# Patient Record
Sex: Female | Born: 1980 | Race: White | Hispanic: No | Marital: Single | Smoking: Current every day smoker
Health system: Southern US, Community
[De-identification: ages and names within clinical notes are randomized; demographics above are authoritative.]

## PROBLEM LIST (undated history)

## (undated) DIAGNOSIS — F99 Mental disorder, not otherwise specified: Secondary | ICD-10-CM

## (undated) DIAGNOSIS — N809 Endometriosis, unspecified: Secondary | ICD-10-CM

## (undated) DIAGNOSIS — R630 Anorexia: Secondary | ICD-10-CM

## (undated) HISTORY — DX: Mental disorder, not otherwise specified: F99

## (undated) HISTORY — PX: WISDOM TOOTH EXTRACTION: SHX21

## (undated) HISTORY — PX: OTHER SURGICAL HISTORY: SHX169

## (undated) HISTORY — DX: Endometriosis, unspecified: N80.9

---

## 2012-04-26 ENCOUNTER — Encounter: Payer: Self-pay | Admitting: Obstetrics and Gynecology

## 2012-04-26 ENCOUNTER — Ambulatory Visit (INDEPENDENT_AMBULATORY_CARE_PROVIDER_SITE_OTHER): Payer: Self-pay | Admitting: Obstetrics and Gynecology

## 2012-04-26 VITALS — BP 92/60 | Temp 98.1°F | Ht 63.0 in | Wt 102.0 lb

## 2012-04-26 DIAGNOSIS — N63 Unspecified lump in unspecified breast: Secondary | ICD-10-CM

## 2012-04-26 DIAGNOSIS — N6320 Unspecified lump in the left breast, unspecified quadrant: Secondary | ICD-10-CM

## 2012-04-26 DIAGNOSIS — N949 Unspecified condition associated with female genital organs and menstrual cycle: Secondary | ICD-10-CM

## 2012-04-26 DIAGNOSIS — Z8659 Personal history of other mental and behavioral disorders: Secondary | ICD-10-CM

## 2012-04-26 DIAGNOSIS — Z124 Encounter for screening for malignant neoplasm of cervix: Secondary | ICD-10-CM

## 2012-04-26 DIAGNOSIS — R102 Pelvic and perineal pain: Secondary | ICD-10-CM

## 2012-04-26 LAB — POCT URINALYSIS DIPSTICK
Bilirubin, UA: NEGATIVE
Glucose, UA: NEGATIVE
Nitrite, UA: NEGATIVE

## 2012-04-26 MED ORDER — TRAMADOL HCL 50 MG PO TABS: 50.0000 mg | ORAL_TABLET | Freq: Four times a day (QID) | ORAL | Status: DC | PRN

## 2012-04-26 NOTE — Progress Notes (Signed)
Pt c/o pelvic pain over the last four years. She states the pain is worse before she starts her period and during her period.  . Subjective: Patient is a 31 y.o. gravida 0 para 0, female.  Presents for evaluation of chronic pelvic pain and dysmenorrhea. Onset of symptoms was gradual starting 4 year ago with unchanged course since that time. The pain occurs all throughout the month and it gets worse just before and with menses. It is located in the LLQ, suprapubic, pelvic and lower back and lasts 4 days. She describes the pain as stabbing, intermittent and shooting. Symptoms improve with vicodin. In the past, she has undergone medical management, including OCP's continuous and ultrasound.  She has no history of PID, STD's. She does desire further childbearing.  Pertinent Gyn History: See above Menses: flow is moderate Bleeding: lasts 7-14 days Contraception: none DES exposure: denies Blood transfusions: none STDs: no past historylast 7-14 days Preventive screening:  Last mammogram: NA Date: na Last pap: normal Date: one year ago  There are no active problems to display for this patient.  Past Medical History  Diagnosis Date  . Mental disorder     Pt lists mental/emotional problems for personal medical history    Past Surgical History  Procedure Date  . Wisdom tooth extraction   . Lymph node      (Not in a hospital admission) Allergies  Allergen Reactions  . Erythromycin     History  Substance Use Topics  . Smoking status: Current Everyday Smoker    Types: Cigarettes  . Smokeless tobacco: Not on file   Comment: 15 cigarettes per day   . Alcohol Use: No    No family history on file.  Review of Systems A comprehensive review of systems was negative except for: Genitourinary: positive for pelvic pain Behavioral/Psych: positive for anorexia   Objective: Vital signs in last 24 hours: @VSRANGES @  Heart exam - S1, S2 normal, no murmur, no gallop, rate regular regular rate  and rhythm Physical Examination: General appearance - alert, well appearing, and in no distress and under weight Mental status - alert, oriented to person, place, and time Chest - clear to auscultation, no wheezes, rales or rhonchi, symmetric air entry Heart - normal rate, regular rhythm, normal S1, S2, no murmurs, rubs, clicks or gallops, normal rate and regular rhythm Abdomen - soft, nontender, nondistended, no masses or organomegaly Breasts - breasts appear normal, no suspicious masses, no skin or nipple changes or axillary nodes Pelvic - normal external genitalia, vulva, vagina, cervix, uterus and adnexa Rectal - normal rectal, no masses Back exam - full range of motion, no tenderness, palpable spasm or pain on motion Musculoskeletal - no joint tenderness, deformity or swelling Extremities - peripheral pulses normal, no pedal edema, no clubbing or cyanosis Skin - normal coloration and turgor, no rashes, no suspicious skin lesions noted Pt underweight normal external genitalia, vulva, vagina, cervix, uterus and adnexa, exam chaperoned by female assistant    Data Review: yes   Assessment/Plan:  AEX CPP H/O anorexia Pap sent Ultram given prn.  [place] @[facility]  Pt states had therapy for anorexia and now recovered

## 2012-04-26 NOTE — Patient Instructions (Signed)

## 2012-04-29 DIAGNOSIS — R102 Pelvic and perineal pain: Secondary | ICD-10-CM | POA: Insufficient documentation

## 2012-04-29 DIAGNOSIS — Z8659 Personal history of other mental and behavioral disorders: Secondary | ICD-10-CM | POA: Insufficient documentation

## 2012-04-30 ENCOUNTER — Ambulatory Visit
Admission: RE | Admit: 2012-04-30 | Discharge: 2012-04-30 | Disposition: A | Discharge status: Home / Self Care | Payer: PRIVATE HEALTH INSURANCE | Source: Ambulatory Visit | Attending: Obstetrics and Gynecology | Admitting: Obstetrics and Gynecology

## 2012-04-30 DIAGNOSIS — Z124 Encounter for screening for malignant neoplasm of cervix: Secondary | ICD-10-CM

## 2012-04-30 LAB — PAP IG, CT-NG, RFX HPV ASCU: Chlamydia Probe Amp: NEGATIVE

## 2012-05-01 ENCOUNTER — Telehealth: Payer: Self-pay

## 2012-05-01 LAB — HUMAN PAPILLOMAVIRUS, HIGH RISK: HPV DNA High Risk: NOT DETECTED

## 2012-05-01 NOTE — Telephone Encounter (Signed)
Message copied by Rolla Plate on Wed May 01, 2012 11:22 AM ------      Message from: Jaymes Graff      Created: Tue Apr 30, 2012  6:24 PM       Confirm with pt [place] of breast WNL>  If she wants a surgery consult as well we can schedule it

## 2012-05-01 NOTE — Telephone Encounter (Signed)
Lm on vm  tcb rgd results 

## 2012-05-02 NOTE — Telephone Encounter (Signed)
Spoke with pt rgd results informed breast u/s wnl pt states will discuss surgery consult at [facility] on 04/1712

## 2012-05-06 ENCOUNTER — Telehealth: Payer: Self-pay

## 2012-05-06 NOTE — Telephone Encounter (Signed)
Message copied by Rolla Plate on Mon May 06, 2012  9:44 AM ------      Message from: Jaymes Graff      Created: Sat May 04, 2012  1:58 AM       Please tell patient her pap results and that she can repeat her pap in one year.  Thank you

## 2012-05-06 NOTE — Telephone Encounter (Signed)
Spoke with pt rgd lads informed pap showed ascus will repeat pap in 1 yr per ND pt voice understanding

## 2012-05-10 ENCOUNTER — Encounter: Payer: Self-pay | Admitting: Obstetrics and Gynecology

## 2012-05-10 ENCOUNTER — Ambulatory Visit (INDEPENDENT_AMBULATORY_CARE_PROVIDER_SITE_OTHER): Payer: PRIVATE HEALTH INSURANCE

## 2012-05-10 ENCOUNTER — Ambulatory Visit (INDEPENDENT_AMBULATORY_CARE_PROVIDER_SITE_OTHER): Payer: PRIVATE HEALTH INSURANCE | Admitting: Obstetrics and Gynecology

## 2012-05-10 VITALS — BP 100/58 | Temp 98.6°F | Resp 16 | Wt 103.0 lb

## 2012-05-10 DIAGNOSIS — N949 Unspecified condition associated with female genital organs and menstrual cycle: Secondary | ICD-10-CM

## 2012-05-10 DIAGNOSIS — R102 Pelvic and perineal pain: Secondary | ICD-10-CM

## 2012-05-10 NOTE — Patient Instructions (Signed)
Patient Education Materials to be provided at check out (*indicates is located in accordion folder):  Laparoscopy

## 2012-05-10 NOTE — Progress Notes (Signed)
Vag. Discharge:no Odor:no Fever:no Irreg.Periods:yes Dyspareunia:no Dysuria:no Frequency:no Urgency:no Hematuria:no Kidney stones:no Constipation:no Diarrhea:no Rectal Bleeding: no Vomiting:no Nausea:yes Pregnant:no Fibroids:no Endometriosis:no Hx of Ovarian Cyst:yes Hx IUD:no Hx STD-PID:no Appendectomy:no Gall Bladder Dz:no Pt states she doesn't get much relief from the tramadol Results for orders placed in visit on 04/26/12  POCT URINALYSIS DIPSTICK      Component Value Range   Color, UA       Clarity, UA       Glucose, UA negative     Bilirubin, UA negative     Ketones, UA negative     Spec Grav, UA 1.015     Blood, UA trace     pH, UA       Protein, UA negative     Urobilinogen, UA negative     Nitrite, UA negative     Leukocytes, UA Negative    PAP IG, CT-NG, RFX HPV ASCU      Component Value Range   Specimen adequacy:       FINAL DIAGNOSIS:   (*)    RECOMMENDATIONS:       Cytotechnologist:       Pathologist:       Chlamydia Probe Amp NEGATIVE     GC Probe Amp NEGATIVE    URINE CULTURE      Component Value Range   Colony Count NO GROWTH     Organism ID, Bacteria NO GROWTH    HUMAN PAPILLOMAVIRUS, HIGH RISK      Component Value Range   HPV DNA High Risk NOT DETECTED    [place] 6.16 by 4.59 cm with normal adnexa bilaterally CPP Pt offered obs, continuous hormones, pain clinic, L/S, lupron, otc pain meds, PT Pt desires to have L/S.  R&B reviewed will schedule.  If endometriosis found plan Lupron

## 2012-05-14 ENCOUNTER — Telehealth: Payer: Self-pay | Admitting: Obstetrics and Gynecology

## 2012-05-14 MED ORDER — HYDROCODONE-ACETAMINOPHEN 5-500 MG PO TABS: 2.0000 | ORAL_TABLET | Freq: Four times a day (QID) | ORAL | Status: AC | PRN

## 2012-05-14 NOTE — Telephone Encounter (Signed)
Spoke with pt rgd msg pt states was givin tramadol for pelvic pain pt states wants something stronger advised pt will consult with ND and call her pt voice understanding consult with ND pt can have Vicodin 5/500 1 po q 6 hrs disp 30 no refills  Informed pt rx sent to pharm pt voice understanding

## 2012-05-14 NOTE — Telephone Encounter (Signed)
Cassandra Colon/nd pt °

## 2012-05-15 ENCOUNTER — Other Ambulatory Visit: Payer: Self-pay | Admitting: Obstetrics and Gynecology

## 2012-05-15 NOTE — Telephone Encounter (Signed)
niccole/epic °

## 2012-05-16 NOTE — Telephone Encounter (Signed)
Spoke with pt rgd msg informed rx sent to pharm pt voice understanding 

## 2012-05-22 ENCOUNTER — Telehealth: Payer: Self-pay | Admitting: Obstetrics and Gynecology

## 2012-05-23 ENCOUNTER — Other Ambulatory Visit: Payer: Self-pay | Admitting: Obstetrics and Gynecology

## 2012-05-23 ENCOUNTER — Telehealth: Payer: Self-pay | Admitting: Obstetrics and Gynecology

## 2012-05-23 NOTE — Telephone Encounter (Signed)
Diagnostic Laparoscopy scheduled for 07/02/12 @ 1:00 with ND/EP.  Medcost effective 12/26/11-07/24/12.  Plan pays 80/20 after a $300 deductible. Pre-op due $315.25 - Adrianne Pridgen

## 2012-06-17 ENCOUNTER — Encounter (HOSPITAL_COMMUNITY): Payer: Self-pay | Admitting: Pharmacy Technician

## 2012-06-25 ENCOUNTER — Encounter (HOSPITAL_COMMUNITY)
Admission: RE | Admit: 2012-06-25 | Discharge: 2012-06-25 | Disposition: A | Discharge status: Home / Self Care | Payer: PRIVATE HEALTH INSURANCE | Source: Ambulatory Visit | Attending: Obstetrics and Gynecology | Admitting: Obstetrics and Gynecology

## 2012-06-25 ENCOUNTER — Encounter (HOSPITAL_COMMUNITY): Payer: Self-pay

## 2012-06-25 HISTORY — DX: Anorexia: R63.0

## 2012-06-25 LAB — CBC
MCV: 95.7 fL (ref 78.0–100.0)
Platelets: 279 10*3/uL (ref 150–400)
RBC: 3.96 MIL/uL (ref 3.87–5.11)
RDW: 12.7 % (ref 11.5–15.5)
WBC: 9.3 10*3/uL (ref 4.0–10.5)

## 2012-06-25 NOTE — Patient Instructions (Addendum)
YOUR PROCEDURE IS SCHEDULED ON:07/02/12  ENTER THROUGH THE MAIN ENTRANCE OF [facility] AT: 1130 am  USE DESK PHONE AND DIAL [phone] TO INFORM us OF YOUR ARRIVAL  CALL [phone] IF YOU HAVE ANY QUESTIONS OR PROBLEMS PRIOR TO YOUR ARRIVAL.  REMEMBER: DO NOT EAT AFTER MIDNIGHT : Monday   SPECIAL INSTRUCTIONS:clear liquids ok until 9am on Tuesday   YOU MAY BRUSH YOUR TEETH THE MORNING OF SURGERY   TAKE THESE MEDICINES THE DAY OF SURGERY WITH SIP OF WATER: none   DO NOT WEAR JEWELRY, EYE MAKEUP, LIPSTICK OR DARK FINGERNAIL POLISH DO NOT WEAR LOTIONS  DO NOT SHAVE FOR 48 HOURS PRIOR TO SURGERY  YOU WILL NOT BE ALLOWED TO DRIVE YOURSELF HOME.

## 2012-07-01 DIAGNOSIS — N949 Unspecified condition associated with female genital organs and menstrual cycle: Secondary | ICD-10-CM

## 2012-07-01 DIAGNOSIS — N83209 Unspecified ovarian cyst, unspecified side: Secondary | ICD-10-CM

## 2012-07-01 DIAGNOSIS — N809 Endometriosis, unspecified: Secondary | ICD-10-CM

## 2012-07-01 NOTE — H&P (Signed)
Pt c/o pelvic pain over the last four years. She states the pain is worse before she starts her period and during her period.  .  Subjective:  Patient is a 31 y.o. gravida 0 para 0, female. Presents for evaluation of chronic pelvic pain and dysmenorrhea. Onset of symptoms was gradual starting 4 year ago with unchanged course since that time. The pain occurs all throughout the month and it gets worse just before and with menses. It is located in the LLQ, suprapubic, pelvic and lower back and lasts 4 days. She describes the pain as stabbing, intermittent and shooting. Symptoms improve with vicodin. In the past, she has undergone medical management, including OCP's continuous and ultrasound. She has no history of PID, STD's. She does desire further childbearing.  Pertinent Gyn History:  See above  Menses: flow is moderate  Bleeding: lasts 7-14 days  Contraception: none  DES exposure: denies  Blood transfusions: none  STDs: no past historylast 7-14 days  Preventive screening:  Last mammogram: NA Date: na  Last pap: normal Date: one year ago  There are no active problems to display for this patient.    Past Medical History    Diagnosis  Date    .  Mental disorder       Pt lists mental/emotional problems for personal medical history    Past Surgical History    Procedure  Date    .  Wisdom tooth extraction     .  Lymph node     (Not in a hospital admission)    Allergies    Allergen  Reactions    .  Erythromycin     History    Substance Use Topics    .  Smoking status:  Current Everyday Smoker      Types:  Cigarettes    .  Smokeless tobacco:  Not on file     Comment: 15 cigarettes per day     .  Alcohol Use:  No     No family history on file.  Review of Systems  A comprehensive review of systems was negative except for: Genitourinary: positive for pelvic pain  Behavioral/Psych: positive for anorexia  Objective:  Vital signs in last 24 hours:  @VSRANGES @  Heart exam - S1, S2  normal, no murmur, no gallop, rate regular  regular rate and rhythm  Physical Examination: General appearance - alert, well appearing, and in no distress and under weight  Mental status - alert, oriented to person, place, and time  Chest - clear to auscultation, no wheezes, rales or rhonchi, symmetric air entry  Heart - normal rate, regular rhythm, normal S1, S2, no murmurs, rubs, clicks or gallops, normal rate and regular rhythm  Abdomen - soft, nontender, nondistended, no masses or organomegaly  Breasts - breasts appear normal, no suspicious masses, no skin or nipple changes or axillary nodes  Pelvic - normal external genitalia, vulva, vagina, cervix, uterus and adnexa  Rectal - normal rectal, no masses  Back exam - full range of motion, no tenderness, palpable spasm or pain on motion  Musculoskeletal - no joint tenderness, deformity or swelling  Extremities - peripheral pulses normal, no pedal edema, no clubbing or cyanosis  Skin - normal coloration and turgor, no rashes, no suspicious skin lesions noted  Pt underweight  normal external genitalia, vulva, vagina, cervix, uterus and adnexa, exam chaperoned by female assistant  Data Review: yes  Assessment/Plan:  AEX  CPP  H/O anorexia  Pap sent  Ultram  given prn. [place] @[facility]   Pt states had therapy for anorexia and now recovered         Links     Previous Version

## 2012-07-02 ENCOUNTER — Encounter (HOSPITAL_COMMUNITY): Payer: Self-pay | Admitting: Anesthesiology

## 2012-07-02 ENCOUNTER — Encounter (HOSPITAL_COMMUNITY): Payer: Self-pay | Admitting: *Deleted

## 2012-07-02 ENCOUNTER — Ambulatory Visit (HOSPITAL_COMMUNITY): Payer: PRIVATE HEALTH INSURANCE | Admitting: Anesthesiology

## 2012-07-02 ENCOUNTER — Ambulatory Visit (HOSPITAL_COMMUNITY)
Admission: RE | Admit: 2012-07-02 | Discharge: 2012-07-02 | Disposition: A | Discharge status: Home / Self Care | Payer: PRIVATE HEALTH INSURANCE | Source: Ambulatory Visit | Attending: Obstetrics and Gynecology | Admitting: Obstetrics and Gynecology

## 2012-07-02 ENCOUNTER — Encounter (HOSPITAL_COMMUNITY)
Admission: RE | Disposition: A | Discharge status: Home / Self Care | Payer: Self-pay | Source: Ambulatory Visit | Attending: Obstetrics and Gynecology

## 2012-07-02 DIAGNOSIS — N949 Unspecified condition associated with female genital organs and menstrual cycle: Secondary | ICD-10-CM | POA: Insufficient documentation

## 2012-07-02 DIAGNOSIS — N801 Endometriosis of ovary: Secondary | ICD-10-CM | POA: Insufficient documentation

## 2012-07-02 DIAGNOSIS — R1032 Left lower quadrant pain: Secondary | ICD-10-CM | POA: Insufficient documentation

## 2012-07-02 DIAGNOSIS — Z01818 Encounter for other preprocedural examination: Secondary | ICD-10-CM | POA: Insufficient documentation

## 2012-07-02 DIAGNOSIS — N80109 Endometriosis of ovary, unspecified side, unspecified depth: Secondary | ICD-10-CM | POA: Insufficient documentation

## 2012-07-02 DIAGNOSIS — Z01812 Encounter for preprocedural laboratory examination: Secondary | ICD-10-CM | POA: Insufficient documentation

## 2012-07-02 DIAGNOSIS — N83209 Unspecified ovarian cyst, unspecified side: Secondary | ICD-10-CM | POA: Insufficient documentation

## 2012-07-02 DIAGNOSIS — N946 Dysmenorrhea, unspecified: Secondary | ICD-10-CM | POA: Insufficient documentation

## 2012-07-02 HISTORY — PX: LAPAROSCOPY: SHX197

## 2012-07-02 HISTORY — PX: OVARIAN CYST REMOVAL: SHX89

## 2012-07-02 HISTORY — PX: ENDOMETRIAL ABLATION: SHX621

## 2012-07-02 LAB — HCG, QUANTITATIVE, PREGNANCY: hCG, Beta Chain, Quant, S: <1 m[IU]/mL (ref <5)

## 2012-07-02 SURGERY — LAPAROSCOPY OPERATIVE
Anesthesia: General | Site: Abdomen | Wound class: Clean Contaminated

## 2012-07-02 MED ORDER — DEXAMETHASONE SODIUM PHOSPHATE 10 MG/ML IJ SOLN
INTRAMUSCULAR | Status: AC
  Filled 2012-07-02: qty 1

## 2012-07-02 MED ORDER — METOCLOPRAMIDE HCL 5 MG/ML IJ SOLN
10.0000 mg | Freq: Once | INTRAMUSCULAR | Status: AC | PRN
  Administered 2012-07-02: 10 mg via INTRAVENOUS

## 2012-07-02 MED ORDER — MIDAZOLAM HCL 2 MG/2ML IJ SOLN
INTRAMUSCULAR | Status: AC
  Filled 2012-07-02: qty 2

## 2012-07-02 MED ORDER — NEOSTIGMINE METHYLSULFATE 1 MG/ML IJ SOLN
INTRAMUSCULAR | Status: DC | PRN
  Administered 2012-07-02: 2 mg via INTRAVENOUS

## 2012-07-02 MED ORDER — KETOROLAC TROMETHAMINE 30 MG/ML IJ SOLN
INTRAMUSCULAR | Status: AC
  Filled 2012-07-02: qty 1

## 2012-07-02 MED ORDER — NEOSTIGMINE METHYLSULFATE 1 MG/ML IJ SOLN
INTRAMUSCULAR | Status: AC
  Filled 2012-07-02: qty 10

## 2012-07-02 MED ORDER — DEXAMETHASONE SODIUM PHOSPHATE 4 MG/ML IJ SOLN
INTRAMUSCULAR | Status: DC | PRN
  Administered 2012-07-02: 10 mg via INTRAVENOUS

## 2012-07-02 MED ORDER — LACTATED RINGERS IV SOLN
INTRAVENOUS | Status: DC
  Administered 2012-07-02 (×4): via INTRAVENOUS

## 2012-07-02 MED ORDER — LEUPROLIDE ACETATE (3 MONTH) 11.25 MG IM KIT
11.2500 mg | PACK | Freq: Once | INTRAMUSCULAR | Status: AC
  Administered 2012-07-02: 11.25 mg via INTRAMUSCULAR
  Filled 2012-07-02: qty 11.25

## 2012-07-02 MED ORDER — BUPIVACAINE-EPINEPHRINE PF 0.25-1:200000 % IJ SOLN
INTRAMUSCULAR | Status: AC
  Filled 2012-07-02: qty 30

## 2012-07-02 MED ORDER — ONDANSETRON HCL 4 MG/2ML IJ SOLN
INTRAMUSCULAR | Status: AC
  Filled 2012-07-02: qty 2

## 2012-07-02 MED ORDER — GLYCOPYRROLATE 0.2 MG/ML IJ SOLN
INTRAMUSCULAR | Status: AC
  Filled 2012-07-02: qty 1

## 2012-07-02 MED ORDER — LIDOCAINE HCL (CARDIAC) 20 MG/ML IV SOLN
INTRAVENOUS | Status: AC
  Filled 2012-07-02: qty 5

## 2012-07-02 MED ORDER — BUPIVACAINE-EPINEPHRINE 0.25% -1:200000 IJ SOLN
INTRAMUSCULAR | Status: DC | PRN
  Administered 2012-07-02: 1 mL

## 2012-07-02 MED ORDER — PROPOFOL 10 MG/ML IV EMUL
INTRAVENOUS | Status: DC | PRN
  Administered 2012-07-02: 110 mg via INTRAVENOUS
  Administered 2012-07-02: 30 mg via INTRAVENOUS
  Administered 2012-07-02: 50 mg via INTRAVENOUS

## 2012-07-02 MED ORDER — MEPERIDINE HCL 25 MG/ML IJ SOLN: 6.2500 mg | INTRAMUSCULAR | Status: DC | PRN

## 2012-07-02 MED ORDER — HYDROCODONE-ACETAMINOPHEN 5-500 MG PO TABS: 1.0000 | ORAL_TABLET | ORAL | Status: AC | PRN

## 2012-07-02 MED ORDER — MIDAZOLAM HCL 5 MG/5ML IJ SOLN
INTRAMUSCULAR | Status: DC | PRN
  Administered 2012-07-02: 2 mg via INTRAVENOUS

## 2012-07-02 MED ORDER — IBUPROFEN 600 MG PO TABS: ORAL_TABLET | ORAL | Status: DC

## 2012-07-02 MED ORDER — HYDROMORPHONE HCL 2 MG PO TABS: 2.0000 mg | ORAL_TABLET | ORAL | Status: AC | PRN

## 2012-07-02 MED ORDER — ONDANSETRON HCL 4 MG/2ML IJ SOLN
INTRAMUSCULAR | Status: DC | PRN
  Administered 2012-07-02: 4 mg via INTRAVENOUS

## 2012-07-02 MED ORDER — LACTATED RINGERS IR SOLN
Status: DC | PRN
  Administered 2012-07-02: 1

## 2012-07-02 MED ORDER — INDIGOTINDISULFONATE SODIUM 8 MG/ML IJ SOLN
INTRAMUSCULAR | Status: AC
  Filled 2012-07-02: qty 5

## 2012-07-02 MED ORDER — METOCLOPRAMIDE HCL 5 MG/ML IJ SOLN
INTRAMUSCULAR | Status: AC
  Administered 2012-07-02: 10 mg via INTRAVENOUS
  Filled 2012-07-02: qty 2

## 2012-07-02 MED ORDER — KETOROLAC TROMETHAMINE 30 MG/ML IJ SOLN
INTRAMUSCULAR | Status: DC | PRN
  Administered 2012-07-02: 30 mg via INTRAVENOUS

## 2012-07-02 MED ORDER — HYDROMORPHONE HCL PF 1 MG/ML IJ SOLN
INTRAMUSCULAR | Status: AC
  Administered 2012-07-02: 0.5 mg via INTRAVENOUS
  Filled 2012-07-02: qty 1

## 2012-07-02 MED ORDER — LIDOCAINE HCL (CARDIAC) 20 MG/ML IV SOLN
INTRAVENOUS | Status: DC | PRN
  Administered 2012-07-02: 50 mg via INTRAVENOUS

## 2012-07-02 MED ORDER — ROCURONIUM BROMIDE 100 MG/10ML IV SOLN
INTRAVENOUS | Status: DC | PRN
  Administered 2012-07-02: 40 mg via INTRAVENOUS
  Administered 2012-07-02: 10 mg via INTRAVENOUS

## 2012-07-02 MED ORDER — GLYCOPYRROLATE 0.2 MG/ML IJ SOLN
INTRAMUSCULAR | Status: DC | PRN
  Administered 2012-07-02: 0.4 mg via INTRAVENOUS

## 2012-07-02 MED ORDER — FENTANYL CITRATE 0.05 MG/ML IJ SOLN
INTRAMUSCULAR | Status: AC
  Filled 2012-07-02: qty 5

## 2012-07-02 MED ORDER — HYDROMORPHONE HCL PF 1 MG/ML IJ SOLN
0.2500 mg | INTRAMUSCULAR | Status: DC | PRN
  Administered 2012-07-02 (×2): 0.5 mg via INTRAVENOUS

## 2012-07-02 MED ORDER — PROPOFOL 10 MG/ML IV EMUL
INTRAVENOUS | Status: AC
  Filled 2012-07-02: qty 20

## 2012-07-02 MED ORDER — FENTANYL CITRATE 0.05 MG/ML IJ SOLN
INTRAMUSCULAR | Status: DC | PRN
  Administered 2012-07-02 (×2): 100 ug via INTRAVENOUS
  Administered 2012-07-02: 50 ug via INTRAVENOUS
  Administered 2012-07-02: 100 ug via INTRAVENOUS
  Administered 2012-07-02: 50 ug via INTRAVENOUS
  Administered 2012-07-02: 100 ug via INTRAVENOUS

## 2012-07-02 SURGICAL SUPPLY — 22 items
CABLE HIGH FREQUENCY MONO STRZ (ELECTRODE) ×3 IMPLANT
CHLORAPREP W/TINT 26ML (MISCELLANEOUS) ×3 IMPLANT
CLOTH BEACON ORANGE TIMEOUT ST (SAFETY) ×3 IMPLANT
DERMABOND ADVANCED (GAUZE/BANDAGES/DRESSINGS) ×1
DERMABOND ADVANCED .7 DNX12 (GAUZE/BANDAGES/DRESSINGS) ×2 IMPLANT
DRESSING TELFA 8X3 (GAUZE/BANDAGES/DRESSINGS) ×3 IMPLANT
GLOVE BIO SURGEON STRL SZ 6.5 (GLOVE) ×6 IMPLANT
GLOVE BIOGEL PI IND STRL 7.0 (GLOVE) ×2 IMPLANT
GLOVE BIOGEL PI INDICATOR 7.0 (GLOVE) ×1
GOWN PREVENTION PLUS LG XLONG (DISPOSABLE) ×9 IMPLANT
NS IRRIG 1000ML POUR BTL (IV SOLUTION) ×3 IMPLANT
PACK LAPAROSCOPY BASIN (CUSTOM PROCEDURE TRAY) ×3 IMPLANT
PROTECTOR NERVE ULNAR (MISCELLANEOUS) ×6 IMPLANT
SET IRRIG TUBING LAPAROSCOPIC (IRRIGATION / IRRIGATOR) ×3 IMPLANT
SLEEVE Z-THREAD 5X100MM (TROCAR) ×3 IMPLANT
SUT MNCRL AB 3-0 PS2 27 (SUTURE) ×3 IMPLANT
SUT VICRYL 0 UR6 27IN ABS (SUTURE) ×6 IMPLANT
TOWEL OR 17X24 6PK STRL BLUE (TOWEL DISPOSABLE) ×6 IMPLANT
TRAY FOLEY CATH 14FR (SET/KITS/TRAYS/PACK) ×3 IMPLANT
TROCAR BALLN 12MMX100 BLUNT (TROCAR) ×3 IMPLANT
TROCAR Z-THREAD FIOS 5X100MM (TROCAR) ×3 IMPLANT
WATER STERILE IRR 1000ML POUR (IV SOLUTION) ×3 IMPLANT

## 2012-07-02 NOTE — Op Note (Signed)
07/02/2012  4:14 PM  PATIENT:  Cassandra Colon  31 y.o. female  PRE-OPERATIVE DIAGNOSIS:  Chronic Pelvic Pain  POST-OPERATIVE DIAGNOSIS:  Chronic Pelvic Pain, left ovarian cyst  PROCEDURE:  Procedure(s) (LRB): LAPAROSCOPY OPERATIVE (N/A) OVARIAN CYSTECTOMY (Left) ENDOMETRIAL ABLATION (N/A) bx of peritoneum on the left side wall  SURGEON:  Surgeon(s) and Role:    * Abiola Behring A Marylouise Mallet, MD - Primary  PHYSICIAN ASSISTANT:   ASSISTANTSLarey Dresser PA   ANESTHESIA:   local and general  EBL:  Total I/O In: 1800 [I.V.:1800] Out: 175 [Urine:125; Colon:50]  Colon ADMINISTERED:none  DRAINS: none   LOCAL MEDICATIONS USED:  MARCAINE     SPECIMEN:  Source of Specimen:  ovqrian cyst wall and left side wall preritoneal bx  DISPOSITION OF SPECIMEN:  PATHOLOGY  COUNTS:  YES  TOURNIQUET:  * No tourniquets in log *  DICTATION: .Dragon Dictation  PLAN OF CARE: Discharge to home after PACU  PATIENT DISPOSITION:  PACU - hemodynamically stable.   Delay start of Pharmacological VTE agent (>24hrs) due to surgical Colon loss or risk of bleeding: no  Patient was taken to the operating room placed in dorsal lithotomy position and prepped and draped in the normal sterile fashion.  Foley catheter was placed into the bladder.  A bivalve was placed into the vagina. r the anterior cervix r was grasped with single-tooth tenaculum and a Hulka manipulator was attached to the  tenaculum to manipulate the uterus.  She was then turned to the umbilicus where a 10 mm infraumbilical incision was made with a scalpel.  This was done after Marcaine was placed around the umbilicus. An incision was carried down to the fascia both sharply and bluntly. The fascia was incised extended bilaterally using Mayo scissors. Fascia was identified tented up and entered sharply with Metzenbaum scissors. The fascia was circumscribed with 0 Vicryl.  The Roseanne Reno was placed into the abdominal cavity. Intra-abdominal placement was confirmed  with the laparoscope and the abdomen was insufflated with CO2 gas. Findings are as follows the patient had a normal size and normal-appearing uterus. A normal sized right ovary and normal-appearing right ovary.  There were small endometrial implants on the posterior side of the ovary.  Patient left ovary had about a 4 cm ovarian cyst.  The left side wall had chocolate cyst appearing lesions in somewhat white lesions consistent with scarring and endometriosis.  Tissue and remaining normal abdominal anatomy the appendix also appeared normal. There is some rundown old Colon cell along the liver edge there is no active bleeding from the liver. Two 5 mm incisions were placed in the left and right lower abdominal quadrants Under direct visualization.  The left cyst was entered using cautery.. That it was just a hemorrhagic cyst. The cyst wall was removed from the ovary. The ovary was made hemostatic using Kleppinger cautery. With a needle was placed into the area with the chocolates appearing lesions in the left sidewall. Order was placed to help with dissection. Peritoneum was removed with by biopsy forceps and sent to pathology. The endometrial implant on the right ovary was ablated with Kleppingers. The remaining peritoneum was ablated with Kleppingers. For the ablation started I did see that the ureter was well above the police of endometriosis. Irrigation was done. Hemostasis was noted.  Trochars were removed under direct visualization of the laparoscope. The fascia was reapproximated by tying the 0 Vicryl suture below the umbilicus. A 10 mm skin incision was reapproximated using 3-0 Monocryl subcuticular fashion. All  the incisions were reinforced with Dermabond. Hulka was removed from the cervix without difficulty. Sponge lap and needle counts were correct patient went to recovery room in stable condition

## 2012-07-02 NOTE — Anesthesia Postprocedure Evaluation (Signed)
  Anesthesia Post-op Note  Patient: Cassandra Colon  Procedure(s) Performed: Procedure(s) (LRB): LAPAROSCOPY OPERATIVE (N/A) OVARIAN CYSTECTOMY (Left) ENDOMETRIAL ABLATION (N/A) Patient is awake and responsive. Pain and nausea are reasonably well controlled. Vital signs are stable and clinically acceptable. Oxygen saturation is clinically acceptable. There are no apparent anesthetic complications at this time. Patient is ready for discharge.

## 2012-07-02 NOTE — Transfer of Care (Signed)
Immediate Anesthesia Transfer of Care Note  Patient: Cassandra Colon  Procedure(s) Performed: Procedure(s) (LRB): LAPAROSCOPY OPERATIVE (N/A) OVARIAN CYSTECTOMY (Left) ENDOMETRIAL ABLATION (N/A)  Patient Location: PACU  Anesthesia Type: General  Level of Consciousness: awake  Airway & Oxygen Therapy: Patient Spontanous Breathing and Patient connected to nasal cannula oxygen  Post-op Assessment: Report given to PACU RN and Post -op Vital signs reviewed and stable  Post vital signs: stable  Complications: No apparent anesthesia complications

## 2012-07-02 NOTE — Anesthesia Procedure Notes (Signed)
Procedure Name: Intubation Date/Time: 07/02/2012 2:14 PM Performed by: Isabella Bowens R Pre-anesthesia Checklist: Patient identified, Emergency Drugs available, Suction available, Patient being monitored and Timeout performed Patient Re-evaluated:Patient Re-evaluated prior to inductionOxygen Delivery Method: Circle system utilized Preoxygenation: Pre-oxygenation with 100% oxygen Intubation Type: IV induction Ventilation: Mask ventilation without difficulty Laryngoscope Size: Mac and 3 Tube type: Oral Tube size: 7.0 mm Number of attempts: 1 Airway Equipment and Method: Stylet Placement Confirmation: ETT inserted through vocal cords under direct vision,  breath sounds checked- equal and bilateral and positive ETCO2 Secured at: 21 cm Tube secured with: Tape Dental Injury: Teeth and Oropharynx as per pre-operative assessment  Difficulty Due To: Difficulty was unanticipated

## 2012-07-02 NOTE — Interval H&P Note (Signed)
History and Physical Interval Note:  07/02/2012 1:01 PM  Cassandra Colon  has presented today for surgery, with the diagnosis of Chronic Pelvic Pain  The various methods of treatment have been discussed with the patient and family. After consideration of risks, benefits and other options for treatment, the patient has consented to  Procedure(s) (LRB): LAPAROSCOPY OPERATIVE (N/A) as a surgical intervention .  The patient's history has been reviewed, patient examined, no change in status, stable for surgery.  I have reviewed the patients' chart and labs.  Questions were answered to the patient's satisfaction.     [id number]  Date of Initial H&P: 04/26/12  History reviewed, patient examined, no change in status, stable for surgery.

## 2012-07-02 NOTE — Anesthesia Preprocedure Evaluation (Signed)
Anesthesia Evaluation  Patient identified by MRN, date of birth, ID band Patient awake    Reviewed: Allergy & Precautions, H&P , NPO status , Patient's Chart, lab work & pertinent test results  Airway Mallampati: III TM Distance: >3 FB Neck ROM: Full    Dental No notable dental hx. (+) Teeth Intact   Pulmonary neg pulmonary ROS, Current Smoker,  breath sounds clear to auscultation  Pulmonary exam normal       Cardiovascular negative cardio ROS  Rhythm:Regular Rate:Normal     Neuro/Psych Anorexianegative neurological ROS     GI/Hepatic negative GI ROS, Neg liver ROS,   Endo/Other  negative endocrine ROS  Renal/GU negative Renal ROS  negative genitourinary   Musculoskeletal negative musculoskeletal ROS (+)   Abdominal   Peds  Hematology negative hematology ROS (+)   Anesthesia Other Findings   Reproductive/Obstetrics Chronic Pelvic Pain                           Anesthesia Physical Anesthesia Plan  ASA: II  Anesthesia Plan: General   Post-op Pain Management:    Induction: Intravenous  Airway Management Planned: Oral ETT  Additional Equipment:   Intra-op Plan:   Post-operative Plan: Extubation in OR  Informed Consent: I have reviewed the patients History and Physical, chart, labs and discussed the procedure including the risks, benefits and alternatives for the proposed anesthesia with the patient or authorized representative who has indicated his/her understanding and acceptance.   Dental advisory given  Plan Discussed with: CRNA, Anesthesiologist and Surgeon  Anesthesia Plan Comments:         Anesthesia Quick Evaluation

## 2012-07-03 ENCOUNTER — Encounter (HOSPITAL_COMMUNITY): Payer: Self-pay | Admitting: Obstetrics and Gynecology

## 2012-07-03 MED FILL — Heparin Sodium (Porcine) Inj 5000 Unit/ML: INTRAMUSCULAR | Qty: 1 | Status: AC

## 2012-07-04 ENCOUNTER — Telehealth: Payer: Self-pay | Admitting: Obstetrics and Gynecology

## 2012-07-04 NOTE — Telephone Encounter (Signed)
Spoke with pt rgd msg pt has question rgd surgery pt states had surgery still bleeding changing pad three times a day advised pt that normal if start soaking a heavy overnight pad an hr call office pt voice understanding

## 2012-07-04 NOTE — Telephone Encounter (Signed)
NICCOLE/EPIC °

## 2012-07-09 ENCOUNTER — Ambulatory Visit (INDEPENDENT_AMBULATORY_CARE_PROVIDER_SITE_OTHER): Payer: PRIVATE HEALTH INSURANCE | Admitting: Obstetrics and Gynecology

## 2012-07-09 ENCOUNTER — Encounter: Payer: Self-pay | Admitting: Obstetrics and Gynecology

## 2012-07-09 VITALS — BP 104/62 | Wt 101.0 lb

## 2012-07-09 DIAGNOSIS — N809 Endometriosis, unspecified: Secondary | ICD-10-CM

## 2012-07-09 NOTE — Patient Instructions (Signed)
Endometriosis Endometriosis is a disease that occurs when the endometrium (lining of the uterus) is misplaced outside of its normal location. It may occur in many locations close to the uterus (womb), but commonly on the ovaries, fallopian tubes, vagina (birth canal) and bowel located close to the uterus. Because the uterus sloughs (expels) its lining every month (menses), there is bleeding whereever the endometrial tissue is located. SYMPTOMS  Often there are no symptoms. However, because blood is irritating to tissues not normally exposed to it, when symptoms occur they vary with the location of the misplaced endometrium. Symptoms often include back and abdominal pain. Periods may be heavier and intercourse may be painful. Infertility may be present. You may have all of these symptoms at one time or another or you may have months with no symptoms at all. Although the symptoms occur mainly during menses, they can occur mid-cycle as well, and usually terminate with menopause. DIAGNOSIS  Your caregiver may recommend a blood test and urine test (urinalysis) to help rule out other conditions. Another common test is ultrasound, a painless procedure that uses sound waves to make a sonogram "picture" of abnormal tissue that could be endometriosis. If your bowel movements are painful around your periods, your caregiver may advise a barium enema (an X-ray of the lower bowel), to try to find the source of your pain. This is sometimes confirmed by laparoscopy. Laparoscopy is a procedure where your caregiver looks into your abdomen with a laparoscope (a small pencil sized telescope). Your caregiver may take a tiny piece of tissue (biopsy) from any abnormal tissue to confirm or document your problem. These tissues are sent to the lab and a pathologist looks at them under the microscope to give a microscopic diagnosis. TREATMENT  Once the diagnosis is made, it can be treated by destruction of the misplaced endometrial  tissue using heat (diathermy), laser, cutting (excision), or chemical means. It may also be treated with hormonal therapy. When using hormonal therapy menses are eliminated, therefore eliminating the monthly exposure to blood by the misplaced endometrial tissue. Only in severe cases is it necessary to perform a hysterectomy with removal of the tubes, uterus and ovaries. HOME CARE INSTRUCTIONS   Only take over-the-counter or prescription medicines for pain, discomfort, or fever as directed by your caregiver.   Avoid activities that produce pain, including a physical sexual relationship.   Do not take aspirin as this may increase bleeding when not on hormonal therapy.   See your caregiver for pain or problems not controlled with treatment.  SEEK IMMEDIATE MEDICAL CARE IF:   Your pain is severe and is not responding to pain medicine.   You develop severe nausea and vomiting, or you cannot keep foods down.   Your pain localizes to the right lower part of your abdomen (possible appendicitis).   You have swelling or increasing pain in the abdomen.   You have a fever.   You see blood in your stool.  MAKE SURE YOU:   Understand these instructions.   Will watch your condition.   Will get help right away if you are not doing well or get worse.  Document Released: 12/08/2000 Document Revised: 11/30/2011 Document Reviewed: 07/29/2008 [employer] Patient Information 2012 [employer]. 

## 2012-07-09 NOTE — Progress Notes (Signed)
Surgery: Laparoscopy Ablation of the Endometriosis  Date:07/02/2012  Eating a regular diet with difficulty.  Bowel movements are not normal  Pain is controlled with current analgesics. Medications being used: ibuprofen (OTC).  Bladder function is returned to normal. Vaginal bleeding: less flow than a normal period Vaginal discharge: no vaginal discharge  BP 104/62  Wt 101 lb (45.813 kg)  LMP 06/11/2012 Physical Examination: General appearance - alert, well appearing, and in no distress Abdomen - soft, nontender, nondistended, no masses or organomegaly Pelvic - normal external genitalia, vulva, vagina, cervix, uterus and adnexa S/p dx Laparoscopy ablation of endometriosis Path c/w endometriosis Pt received Lupron Will decide on BC, something continuous @[facility] .   Pt constipated.  Try miralax. Call with any n/v or severe abdominal pain

## 2012-07-16 ENCOUNTER — Telehealth: Payer: Self-pay | Admitting: Obstetrics and Gynecology

## 2012-07-16 NOTE — Telephone Encounter (Signed)
Spoke with pt rgd msg pt states still having pelvic pain states Vicodin takes the edge off no fever no bleeding just the pelvic discomfort advised pt can take two Vicodin and cn also take ibuprofen to see if that helps with pain offered pt an appt for eval pt out of town all this week advised pt to call office tomorrow to give update on pain pt voice understanding

## 2012-07-17 ENCOUNTER — Telehealth: Payer: Self-pay | Admitting: Obstetrics and Gynecology

## 2012-07-17 NOTE — Telephone Encounter (Signed)
We donth have any open slots?

## 2012-07-17 NOTE — Telephone Encounter (Signed)
Spoke with pt rgd concerns still having pelvic pain wants eval wit ND next week pt out of town this week advised pt will consult with ND and call her back pt voice understanding

## 2012-09-30 ENCOUNTER — Ambulatory Visit (INDEPENDENT_AMBULATORY_CARE_PROVIDER_SITE_OTHER): Payer: BC Managed Care – PPO | Admitting: Obstetrics and Gynecology

## 2012-09-30 ENCOUNTER — Encounter: Payer: Self-pay | Admitting: Obstetrics and Gynecology

## 2012-09-30 VITALS — BP 100/60 | Ht 63.0 in | Wt 101.0 lb

## 2012-09-30 DIAGNOSIS — N809 Endometriosis, unspecified: Secondary | ICD-10-CM

## 2012-09-30 NOTE — Patient Instructions (Signed)
Endometriosis  Endometriosis is a disease that occurs when the endometrium (lining of the uterus) is misplaced outside of its normal location. It may occur in many locations close to the uterus (womb), but commonly on the ovaries, fallopian tubes, vagina (birth canal) and bowel located close to the uterus. Because the uterus sloughs (expels) its lining every month (menses), there is bleeding whereever the endometrial tissue is located.  SYMPTOMS   Often there are no symptoms. However, because blood is irritating to tissues not normally exposed to it, when symptoms occur they vary with the location of the misplaced endometrium. Symptoms often include back and abdominal pain. Periods may be heavier and intercourse may be painful. Infertility may be present. You may have all of these symptoms at one time or another or you may have months with no symptoms at all. Although the symptoms occur mainly during menses, they can occur mid-cycle as well, and usually terminate with menopause.  DIAGNOSIS   Your caregiver may recommend a blood test and urine test (urinalysis) to help rule out other conditions. Another common test is ultrasound, a painless procedure that uses sound waves to make a sonogram "picture" of abnormal tissue that could be endometriosis. If your bowel movements are painful around your periods, your caregiver may advise a barium enema (an X-ray of the lower bowel), to try to find the source of your pain. This is sometimes confirmed by laparoscopy. Laparoscopy is a procedure where your caregiver looks into your abdomen with a laparoscope (a small pencil sized telescope). Your caregiver may take a tiny piece of tissue (biopsy) from any abnormal tissue to confirm or document your problem. These tissues are sent to the lab and a pathologist looks at them under the microscope to give a microscopic diagnosis.  TREATMENT   Once the diagnosis is made, it can be treated by destruction of the misplaced endometrial  tissue using heat (diathermy), laser, cutting (excision), or chemical means. It may also be treated with hormonal therapy. When using hormonal therapy menses are eliminated, therefore eliminating the monthly exposure to blood by the misplaced endometrial tissue. Only in severe cases is it necessary to perform a hysterectomy with removal of the tubes, uterus and ovaries.  HOME CARE INSTRUCTIONS    Only take over-the-counter or prescription medicines for pain, discomfort, or fever as directed by your caregiver.   Avoid activities that produce pain, including a physical sexual relationship.   Do not take aspirin as this may increase bleeding when not on hormonal therapy.   See your caregiver for pain or problems not controlled with treatment.  SEEK IMMEDIATE MEDICAL CARE IF:    Your pain is severe and is not responding to pain medicine.   You develop severe nausea and vomiting, or you cannot keep foods down.   Your pain localizes to the right lower part of your abdomen (possible appendicitis).   You have swelling or increasing pain in the abdomen.   You have a fever.   You see blood in your stool.  MAKE SURE YOU:    Understand these instructions.   Will watch your condition.   Will get help right away if you are not doing well or get worse.  Document Released: 12/08/2000 Document Revised: 03/04/2012 Document Reviewed: 07/29/2008  ExitCare Patient Information 2013 [employer].

## 2012-09-30 NOTE — Progress Notes (Signed)
Pt is s/p L/S and ablation of endometriosis 7/13.  She received lupron and is doing well.  She is a smoker. All treatments again reviewed with the pt .  She desires too try lupron for another three months.   Will give her lupron 11.25 mg and f/u three months after the injection

## 2012-10-01 NOTE — Progress Notes (Signed)
Depo Lupron called to [facility] for depo Lupron 11.25 they will call pt to verify benefits then mail to office

## 2012-10-22 ENCOUNTER — Telehealth: Payer: Self-pay | Admitting: Obstetrics and Gynecology

## 2012-10-22 NOTE — Telephone Encounter (Signed)
vm received from pt rgdg telephone call. Pt states,"if could receive cb after 2:00p today".

## 2012-10-22 NOTE — Telephone Encounter (Signed)
Spoke with pt rgd msg informed pt insurance company left several messages for her tcb informed pt call insurance company at [phone] also informed pt have 50 dolar co pay pt voice understanding

## 2012-10-22 NOTE — Telephone Encounter (Signed)
Lm on vm to cb per telephone call.  

## 2012-11-01 ENCOUNTER — Other Ambulatory Visit: Payer: BC Managed Care – PPO

## 2012-11-04 ENCOUNTER — Other Ambulatory Visit (INDEPENDENT_AMBULATORY_CARE_PROVIDER_SITE_OTHER): Payer: BC Managed Care – PPO

## 2012-11-04 DIAGNOSIS — N809 Endometriosis, unspecified: Secondary | ICD-10-CM

## 2012-11-04 LAB — POCT URINE PREGNANCY: Preg Test, Ur: NEGATIVE

## 2012-11-04 MED ORDER — LEUPROLIDE ACETATE (3 MONTH) 11.25 MG IM KIT
11.2500 mg | PACK | Freq: Once | INTRAMUSCULAR | Status: AC
  Administered 2012-11-04: 11.25 mg via INTRAMUSCULAR

## 2012-12-13 ENCOUNTER — Telehealth: Payer: Self-pay

## 2012-12-13 NOTE — Telephone Encounter (Signed)
Rx refill request received from [facility] for depo lupron. Pt needs f/u appointment per ND before receiving another injection. Tc to pt, advised to schedule f/u. Pt scheduled with ND for 02/06/13, pt agreeable.

## 2012-12-16 ENCOUNTER — Telehealth: Payer: Self-pay | Admitting: Obstetrics and Gynecology

## 2012-12-16 NOTE — Telephone Encounter (Signed)
VM from [facility]. Questioning if fax received re: Lupron.  PHone [phone]

## 2012-12-17 NOTE — Telephone Encounter (Signed)
TC TO PHARMACY TO LET THEM KNOW THAT WE DID RECEIVE FAX FOR PT.

## 2013-02-06 ENCOUNTER — Encounter: Payer: BC Managed Care – PPO | Admitting: Obstetrics and Gynecology

## 2013-06-02 ENCOUNTER — Telehealth: Payer: Self-pay

## 2013-06-02 NOTE — Telephone Encounter (Signed)
6/9 lmtcb/needing to reschedule appointment to [facility] 6/18

## 2013-06-06 NOTE — Telephone Encounter (Signed)
appt rescheduled to 11:15 on 6/18.

## 2013-06-10 ENCOUNTER — Encounter: Payer: Self-pay | Admitting: Obstetrics & Gynecology

## 2013-06-11 ENCOUNTER — Encounter: Payer: Self-pay | Admitting: Obstetrics & Gynecology

## 2013-07-15 ENCOUNTER — Ambulatory Visit (INDEPENDENT_AMBULATORY_CARE_PROVIDER_SITE_OTHER): Payer: BC Managed Care – PPO | Admitting: Obstetrics & Gynecology

## 2013-07-15 ENCOUNTER — Encounter: Payer: Self-pay | Admitting: Obstetrics & Gynecology

## 2013-07-15 VITALS — BP 102/60 | HR 60 | Resp 16 | Ht 63.25 in | Wt 99.8 lb

## 2013-07-15 DIAGNOSIS — N809 Endometriosis, unspecified: Secondary | ICD-10-CM

## 2013-07-15 DIAGNOSIS — N920 Excessive and frequent menstruation with regular cycle: Secondary | ICD-10-CM

## 2013-07-15 DIAGNOSIS — N946 Dysmenorrhea, unspecified: Secondary | ICD-10-CM

## 2013-07-15 NOTE — Progress Notes (Signed)
32 y.o. G0P0000 SingleCaucasianF here for second opinion about endometriosis.  Has seen Dr. Normand Sloop at [facility].  She has been on OCPs in the past.  She was on these for at least two years and several different types.  Reports right before her cycle starts, she has the most severe pain.  Still has pain throughout cycle.  Flow last 10-12 days.  Heavy flow for 6 days.  Uses super tampons and changes every 1.5 hours.    Epic chart reviewed.  Operative note from Dr. Normand Sloop states laparosocpy with biopsy.  Pathology did show endometriosis.  Operative note also states endometrial ablation performed but this is not described in the body of the note.  Patient knows nothing about this procedure.  Did not have watery discharge or severe cramping after procedure.  I do not feel this was actually done.    In reviewing history, "abnormal" pap was obtained last year--04/26/12.  This was ASCUS with neg HR HPV.  Reviewed with patient this is actually a normal pap smear.  She did have follow-up at [facility] where she is in grad school.  Will have her sign a release.  She reports follow-up was normal.    Patient's last menstrual period was 06/18/2013.          Sexually active: yes  The current method of family planning is none.   Patient in female/female relationship. Exercising: no  not regularly Smoker:  yes  Health Maintenance: Pap:  Per patient-6 months ago History of abnormal Pap:  Yes/repeat pap normal MMG:  none Colonoscopy:  none BMD:   2009-slight osteopenia TDaP:  2010   reports that she has been smoking Cigarettes.  She has been smoking about 0.50 packs per day. She has never used smokeless tobacco. She reports that she does not drink alcohol or use illicit drugs.  Past Medical History  Diagnosis Date  . Mental disorder     Pt lists mental/emotional problems for personal medical history  . Anorexia   . Endometriosis     Past Surgical History  Procedure Laterality Date  . Wisdom tooth  extraction    . Lymph node    . Laparoscopy  07/02/2012    Procedure: LAPAROSCOPY OPERATIVE;  Surgeon: Michael Litter, MD;  Location: [facility];  Service: Gynecology;  Laterality: N/A;  lysis of adhesions  . Ovarian cyst removal  07/02/2012    Procedure: OVARIAN CYSTECTOMY;  Surgeon: Michael Litter, MD;  Location: [facility];  Service: Gynecology;  Laterality: Left;  . Endometrial ablation  07/02/2012    Procedure: ENDOMETRIAL ABLATION;  Surgeon: Michael Litter, MD;  Location: [facility];  Service: Gynecology;  Laterality: N/A;    Current Outpatient Prescriptions  Medication Sig Dispense Refill  . HYDROcodone-acetaminophen (NORCO/VICODIN) 5-325 MG per tablet       . ibuprofen (ADVIL,MOTRIN) 600 MG tablet 1 po pc q 6 hours x 5 days then prn  30 tablet  1  . zolpidem (AMBIEN) 5 MG tablet 5 mg as needed.       No current facility-administered medications for this visit.    Family History  Problem Relation Age of Onset  . Cancer Paternal Grandfather     lung  . Lupus Maternal Grandmother     ROS:  Pertinent items are noted in HPI.  Otherwise, a comprehensive ROS was negative.  Exam:   BP 102/60  Pulse 60  Resp 16  Ht 5' 3.25" (1.607 m)  Wt 99 lb 12.8 oz (45.269  kg)  BMI 17.53 kg/m2  LMP 06/18/2013     Height: 5' 3.25" (160.7 cm)  Ht Readings from Last 3 Encounters:  07/15/13 5' 3.25" (1.607 m)  09/30/12 5\' 3"  (1.6 m)  06/25/12 5\' 3"  (1.6 m)    General appearance: alert, cooperative and appears stated age Head: Normocephalic, without obvious abnormality, atraumatic No other physical exam was performed today.  A:  Pelvic pain Endometriosis, biopsy proven Dysmenorrhea Menorrhagia  P:   Lengthy discussion with patient and partner regarding treatment options.  She is not interested in any future childbearing due to sexual preferences.  Partner has children, as well, and patient does not want any of her own.  Depo-Lupron use, again, and then starting on OCPs discussed.  Side effects of hot  flashes and bone loss discussed.  Hysterectomy discussed with limitations discussed.  Feel patient should not have both ovaries removed at this time, if possible, due to age/slight bone frame.  If ovary/ovaries remain, she understands cyclic pain could continue.  From operative note, it does appear that at least one ovary if not both are normal in appearance.  I feel, of course, bleeding would improve.  Most likely pain with cycle would be eliminated.  Patient clearly understands that hysterectomy may not completely resolve all pain issues.  She wants to contemplate options but would like [place] to start process of Depo-Lupron.  ~40 minutes spent with patient >50% of time was in face to face discussion of above.

## 2013-07-16 ENCOUNTER — Telehealth: Payer: Self-pay | Admitting: Obstetrics & Gynecology

## 2013-07-16 NOTE — Telephone Encounter (Signed)
Patient seen 07/15/2013 by Dr. Hyacinth Meeker Left message on CB#VM to return call for more information.

## 2013-07-16 NOTE — Telephone Encounter (Signed)
Pt wanted to let Dr Hyacinth Meeker know that she has changed her mind about what was discussed on yesterday.  She would like to go ahead with the surgery.

## 2013-07-17 NOTE — Patient Instructions (Signed)
We will call when the Depo Lupron is approved.

## 2013-07-18 ENCOUNTER — Telehealth: Payer: Self-pay | Admitting: Obstetrics & Gynecology

## 2013-07-18 NOTE — Telephone Encounter (Signed)
Does she need another consult with you to discuss this, or should I forward to Kennon Rounds to get pre-op process started?

## 2013-07-18 NOTE — Telephone Encounter (Signed)
Patient called to discuss with Dr.Miller about going with the Hysterectomy and not the Lupron .

## 2013-07-20 NOTE — Telephone Encounter (Signed)
Kennon Rounds, Can you tell me where we are with Lupron for this pt.  She wants a hysterectomy but I only just met her.  I would really like to use Lupron again and see if I can get her some more time before proceeding with surgery.

## 2013-07-21 NOTE — Telephone Encounter (Signed)
If she can't afford that, she probably can't afford surgery.  We probably need to precert hysterectomy too because she's called several times.  Can you forward this to [facility] and let patient know we are checking cost of hysterectomy with her benefits and let her know about the Lupron.  Thanks.

## 2013-07-21 NOTE — Telephone Encounter (Signed)
Patient is ready to schedule appointment for endometriosis treatment.  Marland Kitchen

## 2013-07-21 NOTE — Telephone Encounter (Signed)
I have received a message that she has a $100 copay and they have sent her a savings card.  We now have to do a prior auth which I will initiate in the morning and have for you to sign. I am not sure how if she can handle the copay.

## 2013-07-22 NOTE — Telephone Encounter (Signed)
Call to check on prior authorization for Lupaneta.  Fax had been received that patient had coverage with co-pay for $100 and savings care sent but prior auth also required and was to be sent to office for completion.  No prior auth forms have been received.  Per Inetta Fermo, patient does NOT need prior auth and that with co-pay card she should only have to pay $10.  The RX has been forwarded to [facility] and is ready for ship as soon as patient consents to $10 cop ay and requests shipment to [place].  They will expedite this per Dr Rondel Baton request.

## 2013-07-22 NOTE — Telephone Encounter (Signed)
Call to patient to notify of Lupaneta coverage.  Patient states she and dr Hyacinth Meeker discussed two options Lupaneta and surgery and patient has decided she really wants to proceed with surgery.  She requests appointment to discuss surgery with Dr Hyacinth Meeker.  Appt scheduled for 07-24-13 at 0945.

## 2013-07-22 NOTE — Telephone Encounter (Signed)
Please precert this if possible tomorrow.  Robotic assisted TLH/Possible BSO.

## 2013-07-23 NOTE — Telephone Encounter (Signed)
See next phone note. Patient has consult to discuss surgery options.

## 2013-07-24 ENCOUNTER — Encounter: Payer: Self-pay | Admitting: Obstetrics & Gynecology

## 2013-07-24 ENCOUNTER — Ambulatory Visit (INDEPENDENT_AMBULATORY_CARE_PROVIDER_SITE_OTHER): Payer: BC Managed Care – PPO | Admitting: Obstetrics & Gynecology

## 2013-07-24 VITALS — BP 100/68 | HR 80 | Resp 16 | Ht 63.25 in | Wt 97.4 lb

## 2013-07-24 DIAGNOSIS — N809 Endometriosis, unspecified: Secondary | ICD-10-CM

## 2013-07-25 ENCOUNTER — Encounter: Payer: Self-pay | Admitting: Obstetrics & Gynecology

## 2013-07-25 NOTE — Patient Instructions (Signed)
We will call for you to return for lupron dose.

## 2013-07-25 NOTE — Progress Notes (Signed)
32 y.o. Partnered Caucasian female G0P0000 here for continued discussion of endometriosis and possible surgery.  Patient would like to proceed with hysterectomy.  At this point we now have Lupron approved and i would like her to consider continuing with this.  Patient is new to me.  She is young and, although states she is in a female relationship and not desirous of any child bearing, she is still quite young.  i would like the opportunity to get to know her better and be comfortable with the decision she is making.  Her operative pictures are in the system and so not seem very impressive given the amount of pain she is in.  She also has right shoulder pain which she says is alleviated by the Lupron with last use but is now back.  Of course endometriosis can be in other locations but that seems unusual given the small amount that si in her pelvis.    Patient in grad school.  Feels she can only do surgery now or in December or beginning of next summer.  All of this is fine with me.  She is clearly aware that surgery may not fully alleviate her pain, especially the atypical pain.  She is okay with this as her cycle pain seems to be the worst.  Procedure, hospital stay, risks, recovery all discussed.  There really isn't enough time to get anything set now before semester starts.  If patient isn't in school she will lose her visa.   O: Healthy WD,WN female Affect: normal.  A: endometriosis  P: plan starting depo lupron, three month dosing, with add back therapy as soon as meds come in.  Will post surgery for December but patient may put off until next summer.

## 2013-07-29 ENCOUNTER — Telehealth: Payer: Self-pay | Admitting: *Deleted

## 2013-07-29 ENCOUNTER — Other Ambulatory Visit: Payer: Self-pay | Admitting: *Deleted

## 2013-07-29 DIAGNOSIS — N809 Endometriosis, unspecified: Secondary | ICD-10-CM

## 2013-07-29 MED ORDER — LEUPROLIDE ACETATE (3 MONTH) 11.25 MG IM KIT: 11.2500 mg | PACK | INTRAMUSCULAR | Status: DC

## 2013-07-29 NOTE — Telephone Encounter (Signed)
She is in a female female relationship and has no pregnancy risk.  OK to give now.

## 2013-07-29 NOTE — Telephone Encounter (Signed)
Patient notified that Lupron injection has arrived.  She states LMP approx 1 week ago and Dr Hyacinth Meeker instructed to get injection upon arrival.  Appt given for 2 pm tomorrow.  Ok to give now ?

## 2013-07-30 ENCOUNTER — Ambulatory Visit (INDEPENDENT_AMBULATORY_CARE_PROVIDER_SITE_OTHER): Payer: BC Managed Care – PPO | Admitting: *Deleted

## 2013-07-30 VITALS — BP 120/72 | HR 92 | Resp 16 | Wt 97.2 lb

## 2013-07-30 DIAGNOSIS — N809 Endometriosis, unspecified: Secondary | ICD-10-CM

## 2013-07-30 MED ORDER — LEUPROLIDE ACETATE (3 MONTH) 11.25 MG IM KIT
11.2500 mg | PACK | Freq: Once | INTRAMUSCULAR | Status: AC
  Administered 2013-07-30: 11.25 mg via INTRAMUSCULAR

## 2013-07-30 NOTE — Progress Notes (Signed)
Patient here for Lupron injection for endometriosis.  Reports she has been on Lupron in the past so she is very familiar with it and expected side effects.  Reports that hot flashes were her primary side effect in the past. Denies questions regarding Lupron. LMP 07-22-13, denies chance of pregnancy, female partnered relationship and partner, Efraim Kaufmann,  is present with her today.  Reviewed addition of Aygestin 5mg  po QD that was shipped from pharmacy with Lupron injection.  Pharmacy supplied bottle given to patient with patient information booklet (also from pharmacy). Lupron depot 11.25 mg given IM in LUOQ, Lot # [id number] exp 09-15-15.  Patient tolerated injection well. She states that pharmacy will cll her to arrange next shipment date. Plan next injection for 10-30-13.  Call prn.

## 2013-07-30 NOTE — Telephone Encounter (Signed)
appt 07-30-13.

## 2013-07-31 NOTE — Telephone Encounter (Signed)
Call to patient to discuss surgery date preference. Per Dr. Hyacinth Meeker, the patient wants to have surgery in December and we need to go ahead and reserve a date for her.

## 2013-09-23 ENCOUNTER — Telehealth: Payer: Self-pay | Admitting: Orthopedic Surgery

## 2013-09-23 NOTE — Telephone Encounter (Signed)
LMTCB to schedule pre op appt.  aa

## 2013-10-02 ENCOUNTER — Telehealth: Payer: Self-pay | Admitting: Orthopedic Surgery

## 2013-10-02 NOTE — Telephone Encounter (Signed)
LMTCB re: scheduling appts and surgery instructions.  aa

## 2013-10-13 ENCOUNTER — Telehealth: Payer: Self-pay | Admitting: Obstetrics & Gynecology

## 2013-10-13 NOTE — Telephone Encounter (Signed)
Cassandra Colon, she was given Lupron injection on 07/30/13 and note said to plan the next one on 10/30/13. Pharmacy is calling asking if the patient is to continue this. Do I need to call them and let them know we will call when we need this rx or is this something I should go ahead and have them fill and ship to [place] to hold? Please advise.

## 2013-10-13 NOTE — Telephone Encounter (Signed)
Patients pharmacy is calling in regards to a refill for Lupron. They want to follow up and see if the patient is still receiving this treatment since the last refill was on 07/29/13. Please advice.

## 2013-10-13 NOTE — Telephone Encounter (Signed)
Patient has not returned calls from 09-23-13 and 10-02-13 to schedule preop appts.  Call to patient to review scheduled surgery date and status of luron. LMTCB.

## 2013-10-14 NOTE — Telephone Encounter (Signed)
LMTCB to discuss pre op instructions and upcoming Lupron injection date.  aa

## 2013-10-21 NOTE — Telephone Encounter (Signed)
Pharmacy calling re: Lupeneta 90 day RX. Patient is in a psychiatric facility and pharmacy needs to know if patient should continue RX. The facilty called in a refill for the patient to the pharmacy.

## 2013-10-21 NOTE — Telephone Encounter (Signed)
Call to pharm and spoke with Darreld Mclean, Per dr Hyacinth Meeker, need additional information on patient's clinical status and medications before refill.  Advised Darreld Mclean we cannot refill until someone can provide this information.

## 2013-10-21 NOTE — Telephone Encounter (Signed)
Routed to Dr. Miller

## 2013-10-23 NOTE — Telephone Encounter (Signed)
Return call to patient, LMTCB.  

## 2013-10-23 NOTE — Telephone Encounter (Signed)
Patient returning Sally's call and asked Kennon Rounds to call her back as late in the day as possible.

## 2013-10-27 ENCOUNTER — Telehealth: Payer: Self-pay | Admitting: Orthopedic Surgery

## 2013-10-27 NOTE — Telephone Encounter (Signed)
Patient's partner calling to advise [place] that patient is still hospitalized in [place] and due for Lupon injection.  They are requesting we reorder medication to be administered at hospital and the facility MD was not able to order this since they are not a GYN.  Melissa says that release has been signed so that facility may speak to [place].  Call [phone] and speak with [facility].  Called this number, "[facility]" and transferred to VM, LM calling about a patient from our practice in their facility needing Lupron inj, did not leave patients name. LMTCB.  Looking on Internet, [facility] is a facility for treatment of eating disorders.

## 2013-10-27 NOTE — Telephone Encounter (Signed)
Spoke with Coralee North at [employer] about pt's Rx for lupaneta. Pt recently treated at a psych facilty in another state and a doctor there refilled the Rx for a 90 day supply. Pharmacist wanted to check with Dr. Hyacinth Meeker, who originally started pt on med, to make sure pt needed to continue on med. OK for this refill?

## 2013-10-28 NOTE — Telephone Encounter (Signed)
Attempt to call number left by [facility], three times, number out of service.  Call back to [facility] at [facility].LMTCB.  Approx 3:30 pm, call form Narissa at [facility], they psychiatrist there, Dr Teresita Madura, tried to reorder her Lupron but they will not dispense it from him, has to be a GYN.  Sylvester Harder that Dr Leda Quail wants to check on patient status and changes in med list before refilling medication.  Given Dr Flonnie Hailstone cell phone for Dr Lennette Bihari to call to review patient status.    Briefly discussed that this medication has to be mixed prior to administration and that if she has questions she can call us.  Also advised that if we refill medication, we can instruct pharm to either ship there directly or to patient home but prefer it not come to our office.

## 2013-10-28 NOTE — Telephone Encounter (Signed)
          [facility] Received: 1 day ago     Tiffany A Ricky Ala, RN    Phone Number: [phone]             Hey a lady named Illa Level called saying you called her about a consent for medication for a patient but didn't give a name. She says that she is with [facility] and they do not give out medication there so she thinks you may have call the wrong person. She said it may have been for [facility]. That it would make more sense. She said you could call Heidi at [phone] ext [phone].   Tiffany

## 2013-11-10 NOTE — Telephone Encounter (Signed)
Shipping address and contact name and number at their facility.  Patient will also have to confirm to the pharmacy that she wants it shipped there.

## 2013-11-10 NOTE — Telephone Encounter (Signed)
Dr. Teresita Madura called me and I returned his call.  He is not available to talk right now.  Left my cell for him to call back.  Needs Lupron ordered.  He could not order it as not gyn.  I will need address for shipping.  Anything else?

## 2013-11-11 NOTE — Telephone Encounter (Signed)
See next phone note.

## 2013-11-15 NOTE — Telephone Encounter (Signed)
Just FYI.  Dr. Hyacinth Meeker advised me pt will be discharged this weekend.  Her plans are to come back to [place].  She is to call and schedule f/u appt.  Once that is done, we can reorder the Lupron.  Until appt scheduled, we should not.

## 2013-11-17 ENCOUNTER — Telehealth: Payer: Self-pay | Admitting: Obstetrics & Gynecology

## 2013-11-17 NOTE — Telephone Encounter (Signed)
Pt says she need an appointment for a lupron injection. Mentioned that she was overdue?

## 2013-11-17 NOTE — Telephone Encounter (Signed)
Spoke with pt to advise Dr. Hyacinth Meeker needed to see her in the office before reinstating Lupron. Pt agreeable to 11-25-13 appt at 3:45.

## 2013-11-25 ENCOUNTER — Ambulatory Visit (INDEPENDENT_AMBULATORY_CARE_PROVIDER_SITE_OTHER): Payer: BC Managed Care – PPO | Admitting: Obstetrics & Gynecology

## 2013-11-25 VITALS — BP 96/56 | HR 80 | Resp 16 | Ht 63.25 in | Wt 116.2 lb

## 2013-11-25 DIAGNOSIS — N809 Endometriosis, unspecified: Secondary | ICD-10-CM

## 2013-11-26 ENCOUNTER — Telehealth: Payer: Self-pay | Admitting: *Deleted

## 2013-11-26 MED ORDER — LEUPROLIDE & NORETHINDRONE 11.25 & 5  MG CO KIT: 11.2500 mg | PACK | Status: DC

## 2013-11-26 NOTE — Telephone Encounter (Signed)
Call to [facility] regarding refill of Lupron that was not given in November while patient was hospitalized.  Coralee North states that previous RX was discontinued when Dr Teresita Madura could not dispense it.  Needs new RX.  Lupaneta Pack 11.25 mg disp 1 pack with one refill called to [facility].

## 2013-11-26 NOTE — Telephone Encounter (Signed)
Patient notified medication has been ordered and to expect them to call her for permission to ship.

## 2013-12-01 ENCOUNTER — Encounter (HOSPITAL_COMMUNITY): Admission: RE | Payer: Self-pay | Source: Ambulatory Visit

## 2013-12-01 ENCOUNTER — Ambulatory Visit (HOSPITAL_COMMUNITY): Admission: RE | Admit: 2013-12-01 | Payer: Self-pay | Source: Ambulatory Visit | Admitting: Obstetrics & Gynecology

## 2013-12-01 SURGERY — ROBOTIC ASSISTED TOTAL HYSTERECTOMY
Anesthesia: General

## 2013-12-01 NOTE — Telephone Encounter (Signed)
Representative from [employer] called to say they are shipping out this patient's Lupron and it will be here 12/04/13.

## 2013-12-02 NOTE — Telephone Encounter (Signed)
Yes.  Pt not sexually active with males.

## 2013-12-02 NOTE — Telephone Encounter (Signed)
LMP 07-22-13 according to OV note. Ok to administer when injection arrives? Female/female relationship.

## 2013-12-03 ENCOUNTER — Encounter: Payer: Self-pay | Admitting: Obstetrics & Gynecology

## 2013-12-03 DIAGNOSIS — N809 Endometriosis, unspecified: Secondary | ICD-10-CM | POA: Insufficient documentation

## 2013-12-03 NOTE — Progress Notes (Signed)
32 y.o. Married Caucasian female G0P0000 here for discussion of recent hospitalization and endometriosis.  Pt continues to desire surgical treatment via hysterectomy.  She is aware that her ovaries will not be removed and that endometriosis will continue to be stimulated until the time ovaries are removed.  She has currently been on Depo Lupron with good success for six months prior.  She has been on it again for three months this year.  Pt was hospitalized in [place] in residential facility for eating disorder.  Pt reports long hx of this with two prior admissions.  The first one was in [place] when she weight as low as 78 pounds.  The second was several years ago at the same location.  She was 99 pounds when she was admitted this last time.  Her team has advised her to do nothing for the next six months that would interfere with eating, including elective surgery.  For now, she desired to continue the Lupron.  She knows after three months, she will need to switch aware from it and I will transition her to Depo Provera.  She may have enough resolution with this that she can continue to put off surgery.    Pt clearly aware of bone health issues with long term Depo Lupron and it's limitations.  3 month dosing will be ordered.  Pt continues to be in female-female relationship.  She is not cycling but pain is returning.  Last cycle was before prior Lupron dosing.   O: Healthy WD,WN female Affect: normal, talkative and open with last month's experience  A:Enodmetriosis, documented via laparoscopy  P: Continue DepoLupron for 3 more months.  Add back therapy will be used.  Pt will be called when injection is here and she will return for injection.  Calcium intake encouraged.  ~15 minutes spent with patient >50% of time was in face to face discussion of above.

## 2013-12-04 NOTE — Telephone Encounter (Signed)
Call to patient. Notified Lupron (Lupaneta pack) has arrived and nurse appointment scheduled for 830 tomorrow am.

## 2013-12-05 ENCOUNTER — Ambulatory Visit (INDEPENDENT_AMBULATORY_CARE_PROVIDER_SITE_OTHER): Payer: BC Managed Care – PPO | Admitting: *Deleted

## 2013-12-05 VITALS — BP 122/64 | HR 82 | Resp 18 | Wt 116.0 lb

## 2013-12-05 DIAGNOSIS — N809 Endometriosis, unspecified: Secondary | ICD-10-CM

## 2013-12-05 MED ORDER — LEUPROLIDE ACETATE (3 MONTH) 11.25 MG IM KIT
11.2500 mg | PACK | Freq: Once | INTRAMUSCULAR | Status: AC
  Administered 2013-12-05: 11.25 mg via INTRAMUSCULAR

## 2013-12-05 NOTE — Progress Notes (Signed)
Lupron Depot injection 11.25mg  given, pt tolerated injection well. Next injection in 10 weeks appointment scheduled.

## 2013-12-11 ENCOUNTER — Ambulatory Visit: Payer: BC Managed Care – PPO | Admitting: Physician Assistant

## 2013-12-28 ENCOUNTER — Emergency Department (HOSPITAL_BASED_OUTPATIENT_CLINIC_OR_DEPARTMENT_OTHER)
Admission: EM | Admit: 2013-12-28 | Discharge: 2013-12-29 | Disposition: A | Discharge status: Home / Self Care | Payer: BC Managed Care – PPO | Attending: Emergency Medicine | Admitting: Emergency Medicine

## 2013-12-28 ENCOUNTER — Encounter (HOSPITAL_BASED_OUTPATIENT_CLINIC_OR_DEPARTMENT_OTHER): Payer: Self-pay | Admitting: Emergency Medicine

## 2013-12-28 ENCOUNTER — Emergency Department (HOSPITAL_BASED_OUTPATIENT_CLINIC_OR_DEPARTMENT_OTHER): Payer: BC Managed Care – PPO

## 2013-12-28 DIAGNOSIS — T148XXA Other injury of unspecified body region, initial encounter: Secondary | ICD-10-CM

## 2013-12-28 DIAGNOSIS — Z8659 Personal history of other mental and behavioral disorders: Secondary | ICD-10-CM | POA: Insufficient documentation

## 2013-12-28 DIAGNOSIS — Z8742 Personal history of other diseases of the female genital tract: Secondary | ICD-10-CM | POA: Insufficient documentation

## 2013-12-28 DIAGNOSIS — S60221A Contusion of right hand, initial encounter: Secondary | ICD-10-CM

## 2013-12-28 DIAGNOSIS — Y929 Unspecified place or not applicable: Secondary | ICD-10-CM | POA: Insufficient documentation

## 2013-12-28 DIAGNOSIS — IMO0002 Reserved for concepts with insufficient information to code with codable children: Secondary | ICD-10-CM | POA: Insufficient documentation

## 2013-12-28 DIAGNOSIS — W2209XA Striking against other stationary object, initial encounter: Secondary | ICD-10-CM | POA: Insufficient documentation

## 2013-12-28 DIAGNOSIS — S60229A Contusion of unspecified hand, initial encounter: Secondary | ICD-10-CM | POA: Insufficient documentation

## 2013-12-28 DIAGNOSIS — F17200 Nicotine dependence, unspecified, uncomplicated: Secondary | ICD-10-CM | POA: Insufficient documentation

## 2013-12-28 DIAGNOSIS — Y939 Activity, unspecified: Secondary | ICD-10-CM | POA: Insufficient documentation

## 2013-12-28 MED ORDER — OXYCODONE-ACETAMINOPHEN 5-325 MG PO TABS
1.0000 | ORAL_TABLET | Freq: Once | ORAL | Status: AC
  Administered 2013-12-28: 1 via ORAL
  Filled 2013-12-28: qty 1

## 2013-12-28 NOTE — ED Provider Notes (Signed)
CSN: [id number]     Arrival date & time 12/28/13  2237 History  This chart was scribed for Makenah Karas Alfonso Patten, MD by Ludger Nutting, ED Scribe. This patient was seen in room MH01/MH01 and the patient's care was started 11:02 PM.    Chief Complaint  Patient presents with  . Hand Injury    Patient is a 33 y.o. female presenting with hand injury. The history is provided by the patient. No language interpreter was used.  Hand Injury Location:  Hand Injury: yes   Mechanism of injury comment:  Punched a wall with right hand Hand location:  R hand Pain details:    Quality:  Aching   Radiates to:  Does not radiate   Severity:  Moderate   Onset quality:  Sudden   Timing:  Constant   Progression:  Unchanged Chronicity:  New Foreign body present:  No foreign bodies Tetanus status:  Up to date Relieved by:  Nothing Worsened by:  Nothing tried Ineffective treatments:  None tried Associated symptoms: no neck pain, no numbness and no tingling   Risk factors: no frequent fractures     HPI Comments: Cassandra Colon is a 33 y.o. female who presents to the Emergency Department complaining of a right hand injury that occurred about 1 hour ago. Pt states she punched a wall and now has sudden onset, constant, unchanged right hand pain and a laceration to the 2nd digit. She denies numbness. Her last tetanus was 4 years ago.   Past Medical History  Diagnosis Date  . Mental disorder     Pt lists mental/emotional problems for personal medical history  . Anorexia   . Endometriosis    Past Surgical History  Procedure Laterality Date  . Wisdom tooth extraction    . Lymph node    . Laparoscopy  07/02/2012    Procedure: LAPAROSCOPY OPERATIVE;  Surgeon: Betsy Coder, MD;  Location: [facility];  Service: Gynecology;  Laterality: N/A;  lysis of adhesions  . Ovarian cyst removal  07/02/2012    Procedure: OVARIAN CYSTECTOMY;  Surgeon: Betsy Coder, MD;  Location: [facility];  Service: Gynecology;  Laterality:  Left;  . Endometrial ablation  07/02/2012    Procedure: ENDOMETRIAL ABLATION;  Surgeon: Betsy Coder, MD;  Location: [facility];  Service: Gynecology;  Laterality: N/A;   Family History  Problem Relation Age of Onset  . Cancer Paternal Grandfather     lung  . Lupus Maternal Grandmother    History  Substance Use Topics  . Smoking status: Current Every Day Smoker -- 0.50 packs/day    Types: Cigarettes  . Smokeless tobacco: Never Used  . Alcohol Use: No   OB History   Grav Para Term Preterm Abortions TAB SAB Ect Mult Living   0 0 0 0 0 0 0 0 0 0      Review of Systems  Musculoskeletal: Positive for arthralgias (right hand pain). Negative for neck pain.  Neurological: Negative for syncope and numbness.  All other systems reviewed and are negative.    Allergies  Erythromycin  Home Medications   Current Outpatient Rx  Name  Route  Sig  Dispense  Refill  . Cyclobenzaprine HCl (FLEXERIL PO)   Oral   Take by mouth as needed.         Marland Kitchen HYDROcodone-acetaminophen (NORCO/VICODIN) 5-325 MG per tablet               . ibuprofen (ADVIL,MOTRIN) 600 MG tablet  1 po pc q 6 hours x 5 days then prn   30 tablet   1   . Leuprolide & Norethindrone 11.25 & 5  MG KIT   Combination   11.25 mg by Combination route every 3 (three) months.   1 kit   1   . leuprolide (LUPRON DEPOT) 11.25 MG injection   Intramuscular   Inject 11.25 mg into the muscle every 3 (three) months.   1 each   0   . NON FORMULARY      Doxepin-patient unsure of dose         . temazepam (RESTORIL) 30 MG capsule   Oral   Take 30 mg by mouth at bedtime as needed for sleep.         Marland Kitchen zolpidem (AMBIEN) 5 MG tablet      5 mg as needed.          BP 88/53  Pulse 130  Temp(Src) 98 F (36.7 C) (Oral)  Resp 22  Ht 5' 3"  (1.6 m)  Wt 110 lb (49.896 kg)  BMI 19.49 kg/m2  SpO2 100% Physical Exam  Nursing note and vitals reviewed. Constitutional: She is oriented to person, place, and time. She  appears well-developed and well-nourished.  HENT:  Head: Normocephalic and atraumatic.  Mouth/Throat: Oropharynx is clear and moist and mucous membranes are normal. Mucous membranes are not dry. No oropharyngeal exudate, posterior oropharyngeal edema or posterior oropharyngeal erythema.  Eyes: EOM are normal. Pupils are equal, round, and reactive to light.  Neck: Normal range of motion. Neck supple.  Cardiovascular: Normal rate, regular rhythm, normal heart sounds and intact distal pulses.   Pulmonary/Chest: Effort normal and breath sounds normal. No respiratory distress. She has no wheezes. She has no rales.  Abdominal: Soft. Bowel sounds are normal. She exhibits no distension and no mass. There is no tenderness. There is no rebound and no guarding.  Musculoskeletal: She exhibits tenderness.  Right hand: Cap refill < 2 seconds. Swelling and contusions over the 3rd, 4th, and 5th knuckles. Intact radial pulse. Thumb intact. No snuffbox tenderness. Good DTRs.   Neurological: She is alert and oriented to person, place, and time.  Skin: Skin is warm and dry. Abrasion (Abrasion over the right index finger. ) noted.  Psychiatric: She has a normal mood and affect.    ED Course  Procedures (including critical care time)  DIAGNOSTIC STUDIES: Oxygen Saturation is 100% on RA, normal by my interpretation.    COORDINATION OF CARE: 11:04 PM Will order XRAY of the right hand. Discussed treatment plan with pt at bedside and pt agreed to plan.   Labs Review Labs Reviewed - No data to display Imaging Review Dg Hand Complete Right  12/29/2013   CLINICAL DATA:  Punched wall  EXAM: RIGHT HAND - COMPLETE 3+ VIEW  COMPARISON:  None.  FINDINGS: The joints are aligned. No evidence of subluxation or dislocation. On the lateral view, soft tissue swelling is seen dorsal to the metacarpophalangeal joints. No acute or healing fracture is identified. No radiopaque foreign body.  IMPRESSION: No evidence of acute  fracture.  Door bursal soft tissue swelling.   Electronically Signed   By: Curlene Dolphin M.D.   On: 12/29/2013 00:25    EKG Interpretation   None       MDM  No diagnosis found. Will treat with pain medication.  Ice elevation  I personally performed the services described in this documentation, which was scribed in my presence. The recorded  information has been reviewed and is accurate.   Carlisle Beers, MD 12/29/13 [phone]

## 2013-12-28 NOTE — ED Notes (Addendum)
Patient with injury to her right hand from punching a wall.  Patient is very anxious at triage and is unable to answer questions.  Patient states that is is having a "anxiety attack" and feels dizzy.  Patient has blister/injury to her right hand and laceration to left index finger.

## 2013-12-28 NOTE — ED Notes (Signed)
RN Reita ClicheBobby informed of Pts Vitals

## 2013-12-29 MED ORDER — HYDROCODONE-ACETAMINOPHEN 7.5-325 MG/15ML PO SOLN: 10.0000 mL | Freq: Four times a day (QID) | ORAL | Status: DC | PRN

## 2014-01-08 ENCOUNTER — Ambulatory Visit: Payer: BC Managed Care – PPO | Admitting: Physician Assistant

## 2014-02-26 DIAGNOSIS — R630 Anorexia: Secondary | ICD-10-CM | POA: Insufficient documentation

## 2014-02-26 DIAGNOSIS — M25519 Pain in unspecified shoulder: Secondary | ICD-10-CM | POA: Insufficient documentation

## 2014-03-04 ENCOUNTER — Encounter: Payer: Self-pay | Admitting: Obstetrics & Gynecology

## 2014-03-04 ENCOUNTER — Ambulatory Visit (INDEPENDENT_AMBULATORY_CARE_PROVIDER_SITE_OTHER): Payer: BC Managed Care – PPO | Admitting: Obstetrics & Gynecology

## 2014-03-04 VITALS — BP 96/62 | HR 107 | Resp 18 | Ht 63.25 in | Wt 101.0 lb

## 2014-03-04 DIAGNOSIS — N809 Endometriosis, unspecified: Secondary | ICD-10-CM

## 2014-03-04 NOTE — Progress Notes (Signed)
33 y.o. Single Caucasian female G0P0000 here for follow up of endometriosis.  Pain free now and has been this way since a few days after starting the Depo-Lupron.  She has been in it for six months at this time.  She continues to desire proceeding with hysterectomy.  Clearly aware I will try to save at least one ovary and that she may end up with surgery in the future due to pain on remaining ovary. She is aware she cannot stay on Depo Lupron long term.  Has transferred care for anorexia to local providers.  Names give.  Weight is back down.  Will need to communicate with providers to ensure safe to proceed with surgery.    Pt desires surgery in late may or early June.  Will proceed with scheduling and clearance for surgery.  School is good but taking summer off.  Still in same relationship which pt states is good.   O: Healthy WD,WN female Affect: normal  A:Pelvic pain Endometriosis   P: Change DepoLupron to 1 month injections.  Orders done.  Pt will return for injection.  No pregnancy risk due to female/female relationship.  Pt states this is the same and no changes.  Katie Holder--nutritionist Kelsey Thompson--therapist.  [facility].   Clydie BraunKaren Richter--[place]  ~15 minutes spent with patient >50% of time was in face to face discussion of above.

## 2014-03-06 ENCOUNTER — Telehealth: Payer: Self-pay | Admitting: Obstetrics & Gynecology

## 2014-03-06 ENCOUNTER — Ambulatory Visit: Payer: BC Managed Care – PPO | Admitting: Obstetrics & Gynecology

## 2014-03-06 NOTE — Telephone Encounter (Signed)
Pharmacy is calling to set up a date and time to deliver medication here for patient.

## 2014-03-06 NOTE — Telephone Encounter (Signed)
Message copied by Alisa GraffYEAKLEY, Ellason Segar on Fri Mar 06, 2014  1:57 PM ------      Message from: Jerene BearsMILLER, MARY S      Created: Thu Mar 05, 2014 12:59 AM      Regarding: lupron       Need to change order from 3 month depo lupron injection to one month.  THanks. ------

## 2014-03-06 NOTE — Telephone Encounter (Signed)
Kennon RoundsSally, can you help us with this?

## 2014-03-06 NOTE — Telephone Encounter (Signed)
Return call to [facility].  Advised of order to change does to 3.75 mg of Lupro. Phone order given to Dr Willa RoughHicks at [facility].  They will contact patient to arrange shipment.

## 2014-03-10 NOTE — Telephone Encounter (Signed)
Cassandra Colon from [facility] is calling again about the delivery of the medication

## 2014-03-12 MED ORDER — LEUPROLIDE ACETATE 3.75 MG IM KIT: 3.7500 mg | PACK | Freq: Once | INTRAMUSCULAR | Status: DC

## 2014-03-12 NOTE — Telephone Encounter (Signed)
Call back to [facility]. Per Leta JunglingMarcia, injection will arrive tomorrow.

## 2014-03-12 NOTE — Telephone Encounter (Signed)
Call to patient, VM has number confirmation. LMTCB.

## 2014-03-12 NOTE — Telephone Encounter (Signed)
Patient returned call.  Lupron Injection scheduled for Monday 03-16-14 at 0830 since unsure what time injection will arrive tomorrow.  Routing to provider for final review. Patient agreeable to disposition. Will close encounter

## 2014-03-16 ENCOUNTER — Ambulatory Visit (INDEPENDENT_AMBULATORY_CARE_PROVIDER_SITE_OTHER): Payer: BC Managed Care – PPO

## 2014-03-16 VITALS — BP 98/60 | HR 50 | Ht 63.25 in | Wt 99.8 lb

## 2014-03-16 DIAGNOSIS — N809 Endometriosis, unspecified: Secondary | ICD-10-CM

## 2014-03-16 MED ORDER — LEUPROLIDE ACETATE 3.75 MG IM KIT
3.7500 mg | PACK | Freq: Once | INTRAMUSCULAR | Status: AC
  Administered 2014-03-16: 3.75 mg via INTRAMUSCULAR

## 2014-03-24 ENCOUNTER — Encounter: Payer: Self-pay | Admitting: Obstetrics & Gynecology

## 2014-03-31 ENCOUNTER — Telehealth: Payer: Self-pay | Admitting: Obstetrics & Gynecology

## 2014-03-31 NOTE — Telephone Encounter (Signed)
Left message for patient to call back. Need to go over surgery coverages

## 2014-03-31 NOTE — Telephone Encounter (Signed)
Patient returned call. Advised patient that per benefit quote received, she will have zero responsibility for the surgeons charges. Patient agreeable. Wants to be scheduled either May 11th or 18th if possible.

## 2014-04-09 ENCOUNTER — Telehealth: Payer: Self-pay | Admitting: Orthopedic Surgery

## 2014-04-09 NOTE — Telephone Encounter (Signed)
LMTCB for instructions and making appts. 

## 2014-04-09 NOTE — Telephone Encounter (Signed)
Spoke with patient. Advised surgery scheduled for Monday 05/11/14 at 12:00 needs to arrive two hours early. A week or two before surgery [facility] will call to schedule pre-op appointment (this is different then pre-op appointment with Dr.Miller). Two weeks before surgery patient should not take Aspirin, Advil, Aleve, Fish Oil, Vitamin E, or Herbal supplements. Can take Tylenol for any discomfort. Do not take any other pain medication unless cleared by doctor. May continue taking regular multi-vitamins if taking any. No bowel prep is needed for surgery. Do not eat or drink anything after midnight on the night before surgery including water. Dress casual. Do not bring any valuables, wear makeup, jewelry, or lotion. Do not shave 48 hours prior to surgery. Can expect to spend the night in hospital day of surgery. Bring extra casual clothes to wear home. Pre op appointment made for 04-21-14 at 1400 with Dr.Miller. One week post op appointment made for 05-22-14 at 1430 with Dr.Miller. Four week post op appointment made for 06-08-14 at 1300 with Dr.Miller. Patient is agreeable to all instructions and appointment dates and times. Will call back with any further questions.  Routing to provider for final review. Patient agreeable to disposition. Will close encounter

## 2014-04-16 ENCOUNTER — Telehealth: Payer: Self-pay | Admitting: Obstetrics & Gynecology

## 2014-04-16 NOTE — Telephone Encounter (Signed)
Iva BoopKatie Holder from [facility] is calling to speak with Dr Hyacinth MeekerMiller regarding pt. She is treating her for anorexia and pt is refusing residential care because she says she has surgery coming up. Pt's weight is 95.2 lbs with a bmi 6143f 16.2 and eating 800 calories a day. Katie faxed over a release giving her permission to talk to Dr Hyacinth MeekerMiller to make sure pt is cleared for surgery. Can reach her at [phone] or email [email or web address]

## 2014-04-16 NOTE — Telephone Encounter (Signed)
Called Cassandra BoopKatie Holder and left her my cell phone.  Please leave encounter open but I have reached out to her.

## 2014-04-17 NOTE — Telephone Encounter (Signed)
Spoke to patient. Advised Per Dr Hyacinth MeekerMiller, recommend cancel surgery for now until she is in better physical and nutritional condition to recover from surgical procedure.  Advised Dr Hyacinth MeekerMiller still committed to helping her with plan for surgery. Will need to continue Lupron and we will re-order.    Patient returns to school 3rd week of August.

## 2014-04-17 NOTE — Telephone Encounter (Signed)
Patient is asking to talk with Cassandra Colon. No details given.

## 2014-04-20 MED ORDER — LEUPROLIDE ACETATE 3.75 MG IM KIT: 3.7500 mg | PACK | Freq: Once | INTRAMUSCULAR | Status: DC

## 2014-04-20 NOTE — Telephone Encounter (Signed)
New order to [facility] for Lupron Depot 3.75 mg IM q month, disp one now with two refills.  Hank will process and have someone contact patient for authorization to ship.

## 2014-04-20 NOTE — Telephone Encounter (Signed)
Patient returned call, notified that refill for Lupron has been called in. Confirmed all appointments for previously scheduled surgery on 05-11-14 have been canceled.  Routing to provider for final review. Patient agreeable to disposition. Will close encounter

## 2014-04-20 NOTE — Telephone Encounter (Signed)
Call to patient to update regarding Lupron order, LMTCB.

## 2014-04-21 ENCOUNTER — Institutional Professional Consult (permissible substitution): Payer: BC Managed Care – PPO | Admitting: Obstetrics & Gynecology

## 2014-04-23 ENCOUNTER — Telehealth: Payer: Self-pay | Admitting: Obstetrics & Gynecology

## 2014-04-23 NOTE — Telephone Encounter (Signed)
Dr. Hyacinth MeekerMiller,  Please see the documentation below. I originally sent this as an Games developerinbasket message. I should have sent it to you as a telephone call. I just wanted to get it to you the correct way and make sure it had been address. Cassandra Colon   Cassandra Suzanne Miller, Cassandra Colon Cassandra Colon    Phone Number: [phone]             Just want you to know I did call and leave a message. I will try again before I leave.   MSM      Sent: 04/16/2014 2:30 PM  To: Annamaria BootsMary Suzanne Miller, Cassandra Colon  Subject: [facility] Counselor   Dr. Hyacinth MeekerMiller,  Cassandra PrayKelsey Colon from [facility] is calling to speak with you about this patient. Please call her back when you are available.  Thank you,  Cassandra KungStarla

## 2014-04-23 NOTE — Telephone Encounter (Signed)
I handled this last Friday.  Surgery is cancelled.  Pt going to residential facility for in-pt treatment of anorexia.  Encounter closed.  Thanks.

## 2014-05-05 ENCOUNTER — Telehealth: Payer: Self-pay | Admitting: Obstetrics & Gynecology

## 2014-05-05 NOTE — Telephone Encounter (Signed)
Return call to patient and Lupron injection scheduled for tomorrow at 1000.  Routing to provider for final review. Patient agreeable to disposition. Will close encounter

## 2014-05-05 NOTE — Telephone Encounter (Signed)
Pt is calling to talk with Kennon RoundsSally.

## 2014-05-06 ENCOUNTER — Ambulatory Visit (INDEPENDENT_AMBULATORY_CARE_PROVIDER_SITE_OTHER): Payer: BC Managed Care – PPO | Admitting: *Deleted

## 2014-05-06 VITALS — BP 100/64 | HR 72 | Ht 63.75 in | Wt 97.0 lb

## 2014-05-06 DIAGNOSIS — N809 Endometriosis, unspecified: Secondary | ICD-10-CM

## 2014-05-06 MED ORDER — LEUPROLIDE ACETATE 3.75 MG IM KIT
3.7500 mg | PACK | Freq: Once | INTRAMUSCULAR | Status: AC
  Administered 2014-05-06: 3.75 mg via INTRAMUSCULAR

## 2014-05-06 NOTE — Progress Notes (Signed)
Patient ID: Cassandra Colon, female   DOB: June 14, 1981, 33 y.o.   MRN: [id number]  Pt presented for Lupron injection.  Injection tolerated well in left gluteal.  Pt states she generally has cramping the week after she gets her injection, but otherwise does fine.  When asked pt declines any new medications.  She does not have any additional questions today.

## 2014-05-22 ENCOUNTER — Ambulatory Visit: Payer: BC Managed Care – PPO | Admitting: Obstetrics & Gynecology

## 2014-06-03 ENCOUNTER — Telehealth: Payer: Self-pay | Admitting: Obstetrics & Gynecology

## 2014-06-03 NOTE — Telephone Encounter (Signed)
Left message advising patient to give our office a call back to verify where she would like lupron rx sent. Okay per ROI.   Need to know if patient would like lupron be sent to her home or if she would like it sent here so that she can get it when she returns as pharmacy is not authorized to send it to [place].

## 2014-06-03 NOTE — Telephone Encounter (Signed)
Spoke with patient. Patient states that she wants the rx sent to her in [place]. "I was not told by Kennon Rounds or Dr.Miller that this would be a problem. I did not know I was not going to be able to get it sent to me here." Advised patient that rx could be sent her for pickup or to home address and sent to patient per Kennon Rounds. Patient states " It has to be refrigerated it is not like we are talking about a bottle of pills." Patient would like to continue taking Lupron while in treatment stating " I have a nurse here who is over my care and who can give it to me. You may be best to take to her." Nurses name is Hermine Messick at [facility] treatment center phone number is [phone]. Advised patient would look into how we should proceed with Lupron shipment and to where is best and give patient a call back. Patient agreeable.

## 2014-06-03 NOTE — Telephone Encounter (Signed)
Returning a call to Kaitlyn. °

## 2014-06-03 NOTE — Telephone Encounter (Signed)
Parmacy is calling because they fill lupron for the patient and she is not in [place] right now she is in [place] at a rehab facility and does not know when she will be back in [place] and they are not authorized to send the medication there to the rehab facility they want to know what we want them to do

## 2014-06-04 NOTE — Telephone Encounter (Signed)
Call  To [employer], [phone],  Aram Beecham, and advised that medication should be shipped to her Rehab facility in [place].Dr Hyacinth Meeker is aware she is in the facility for treatment and want her to maintain medication schedule. Preferred to have medication shipped directly to patient in [place]. Aram Beecham confirms with supervisor, they can ship medication to patient now that they have our permission.  Call to patient's nurse, Hermine Messick at [phone], advised that medication can be shipped to their facility as soon as patient is able to give them the address.  Discussed that medication does not requite refrigeration but that it is shipped in temp controlled container to avoid excessive temp extermes during shipment.  Advised they can contact me here at Dr Rondel Baton office if additonal assistance needed and they will get message to [place] so she knows this has been resolved.  Routing to provider for final review. Patient agreeable to disposition. Will close encounter.

## 2014-06-08 ENCOUNTER — Ambulatory Visit: Payer: BC Managed Care – PPO | Admitting: Obstetrics & Gynecology

## 2014-06-29 ENCOUNTER — Telehealth: Payer: Self-pay | Admitting: *Deleted

## 2014-06-29 NOTE — Telephone Encounter (Signed)
Fax ROI received from [facility] authorizing them to release and obtain information from us to coordianate treatment.  No phone number listed and no information requested.  Patient is scheduled for surgery on 07-27-14.  Call to patient cell number and left message calling for update.

## 2014-07-01 NOTE — Telephone Encounter (Signed)
Patsy LagerYolanda has attempted to reach this patient regarding her Lupron shipment. Patsy LagerYolanda has been unable to reach this patient.

## 2014-07-06 NOTE — Telephone Encounter (Signed)
Call facility.  No one could speak with me.  Left my cell number for someone to call.

## 2014-07-06 NOTE — Telephone Encounter (Signed)
Call from patient. States she is still in treatment center and her medical team is not sure she will be ready for surgery on 07-27-14.  She was instructed by the team to check with Dr Hyacinth MeekerMiller on what options are.  Pharmacy has contacted her regarding next lupron dose and patient is checking to see if should proceed.  Patient has signed ROI so she said ok to call treatment center and ask for "nursing" so they can update her on their plan. Phone for treatment center [phone]. can also call patient's cell and she will try to watch for call.

## 2014-07-06 NOTE — Telephone Encounter (Signed)
Call to patient, LMTCB

## 2014-07-10 NOTE — Telephone Encounter (Signed)
Dr Hyacinth MeekerMiller has personally called facility and left message to call with update.

## 2014-07-10 NOTE — Telephone Encounter (Signed)
Left message for clinical psychologist at facility [phone].

## 2014-07-15 NOTE — Telephone Encounter (Signed)
Reviewed with Dr Hyacinth MeekerMiller and surgery case rescheduled to 09-07-14 pending report from treatment center.

## 2014-07-23 ENCOUNTER — Other Ambulatory Visit: Payer: Self-pay

## 2014-07-23 MED ORDER — LEUPROLIDE ACETATE 3.75 MG IM KIT: 3.7500 mg | PACK | Freq: Once | INTRAMUSCULAR | Status: DC

## 2014-07-23 NOTE — Telephone Encounter (Signed)
rx called in

## 2014-07-23 NOTE — Telephone Encounter (Signed)
Last Office visit with Dr. Hyacinth MeekerMiller 03/04/14 Last refill: 04/20/13 #1, 2rf Surgery in September Next Appt/AEX: Not scheduled

## 2014-08-11 ENCOUNTER — Encounter: Payer: BC Managed Care – PPO | Attending: Family Medicine | Admitting: *Deleted

## 2014-08-11 ENCOUNTER — Encounter: Payer: Self-pay | Admitting: *Deleted

## 2014-08-11 VITALS — Ht 63.0 in | Wt 114.0 lb

## 2014-08-11 DIAGNOSIS — Z713 Dietary counseling and surveillance: Secondary | ICD-10-CM | POA: Diagnosis present

## 2014-08-11 DIAGNOSIS — F5000 Anorexia nervosa, unspecified: Secondary | ICD-10-CM | POA: Diagnosis not present

## 2014-08-11 DIAGNOSIS — IMO0002 Reserved for concepts with insufficient information to code with codable children: Secondary | ICD-10-CM | POA: Insufficient documentation

## 2014-08-11 NOTE — Patient Instructions (Addendum)
Breakfast: eat bagel with cream cheese in the car AM snack: M&Ms or granola bar and fruit Lunch: 3-4 snacky things: chips, almonds, fruit, granola bar.  nutella on bread (1 tbsp) with chips and fruit PM snack just in case: granola

## 2014-08-11 NOTE — Progress Notes (Signed)
Assessment:  Patient was seen on 08/11/14 for nutrition counseling pertaining to disordered eating  Primary care MD: Nadyne Coombes in [facility] Therapist: Gery Pray transitioning Any other medical team members: none  Assessment-  Cassandra Colon is a senior At [employer], studying psychology.  She started back at school yesterday  Doesn't do well at school with following her meal plan, but she is required to stay in school due to her international Visa.  She is currently taking 12 credit hours: No classes tues or thurs MWF 9, 1, 2.  And online : spanish, alcohol and tobacco, global health.  Cassandra Colon states that the timing isn't right for her recovery from her eating disorder.  Her living situation isn't good, but she feels that she can't leave her current home.  She lives with her partner (female, I think, but I'm not sure) and her partner's 3 kids.  Cassandra Colon is responsible for taking the kids to and from school.  She doesn't work in addition to school.  She has a significant trauma history that we didn't get into today, but she feels like that needs to be dealt with before she can deal with her eating disorder.  She says she will be able to focus better once she is out of school in 3 years (she plans on attending grad school next year).  She doesn't think her eating disorder is a pressing concern because she has never had any medical complications.  She says she has lost friends to their eating disorders, but she has never been "ill." She states her lab work always is fine and she's never had any issues with her schoolwork being affected.  She says her grades are always straight As so she never had focus or concentration issues.  She is content to survive right now, not to thrive.  She likes being less than 100 pounds because she can't feel as much when she's restricted.  She can control her feelings and stay in a numb state.  If she's underweight, her senses are dulled.  This must have something to do with her  trauma; she doesn't want to feel it right now or deal with it so she stays in a "safe place."  She also describes being less than 100 as being "clean."  She has issues with food spoilage- she wont' eat anything unless it's straight out of the fridge, not even in an insulted cooler with an ice pack.  She doesn't like to be weighed, doesn't want to know what she weighs.  When I questioned her about how to keep her accountable, she states that her partner won't be a good accountability person so she guesses she will have to be weighed by me.    Weight: 114 lb Height: 63 in Expected body weight: 115 + 11 pounds (101-126) Percent expected body weight: 100%  Eating history: Length of time: started restricting as a child got worse as a teen around age 43 Previous treatments: was hospitalized involuntarily in [place] a couple times, but they didn't really treat her; she was just refed and put on too much psychotropic medications.  She came to the [place] about 5 years ago to attend college.  Since then she has been in and out of [facility] 3-4 times.  Worked with Iva Boop, RD in the past Goals for RD meetings: Cassandra Colon doesn't really like working with an RD; she does it because it's mandatory.  She feels that she can do an RDs job fine.  However,  she does like someone being direct;  Don't beat around the bush. She needs to be able to trust someone and it takes awhile for her to learn to trust.  Likes RD to be flexible.  Likes it's when RD understands what it means to have an eating disorder;  don't just throw food at her.    Weight history:  Highest weight: 118 lb   Lowest weight: 77 lb Most consistent weight: 98 lb  What would you like to weigh:98 lb How has weight changed in the past year: refed at [facility] most recently; had previously lost what she gained in treatment in November  Medical Information: states she's never had medical complications from her eating disorder.  She doesn't think it's that  serious becauseher body can withstand whatever she does to it.  Changes in hair, skin, nails since ED started: denies Chewing/swallowing difficulties : none Relux or heartburn: none Trouble with teeth: none LMP without the use of hormones: 2 years ago.  On medication to stop it   Constipation, diarrhea: none currently, none.  BM yesterday  Mental health diagnosis: Anorexia nervosa   Dietary assessment: A typical day consists of 1-3 meals and 1-3 snacks  Safe foods include: anything white, vegetables, fruit, some candy Avoided foods include:anything cady, chocolate, anything in restaurant, fried foods, meat, colored foods.  No yogurt.  doesn't eat peanut butter- it's American  24 hour recall: varied Fist day out of treatmetn was bad, 2nd day and 4th day were good. Yesterday wasn't so great.  Did eat breakfast, but not snack.    Discharged on meal plan: Per [facility]: not much imput B: bagel with milk and fruit or cereal with milk and fruit S: 2 oz m&m L: sandiwch with chips and fruit P: muddy buddy D: whatever family eats S: dessert Beverages: water, some milk (2%)  Not on official exercise restiction, but self- imposed  What Methods Do You Use To Control Your Weight (Compensatory behaviors)?           Restricting (calories, fat, carbs): restricting total calories.  Trying to follow meal plan- in the past limits to 200-700 kcal.  Restrict fluids (just  sip or might not even drink full bottle  SIV: denies  Diet pills: in the past  Laxatives: not recently  Diuretics: denies  Alcohol or drugs: denies  Exercise (what type):  Can overexerise.  doesn't allow herself to .  Exercise all time if she lets herself.    Food rules or rituals (explain): there's something there with her trauma that we didn't get into today  Binge: denies  Estimated energy intake: Varies.  If she follows her meal plan she eats probably close to 1800 calories.    Estimated energy needs: 1800 kcal 225  g CHO 90 g pro 60 g fat  Nutrition Diagnosis: NB-1.2 Harmful beliefs/attitudes about food or nutrition-related topics As related to proper balance of fat, carbohydrates, proteins to ensure adequate calories.  As evidenced by eating disorder.  Intervention/Goals: Told Cassandra Colon that not focusing on her eating disorder until 3 years from now is not acceptable.  Strategized ways to ensure she follows the meal plan while at school: eat breakfast in the car, take morning snack and afternoon snack with her and make lunch more flexible so that she feels comfortable eating away from a fridge:  Breakfast: eat bagel with cream cheese in the car AM snack: M&Ms or granola bar and fruit Lunch: 3-4 snacky things: chips, almonds, fruit, granola bar.  nutella on bread (1 tbsp) with chips and fruit PM snack just in case: granola   Meal plan:    3 meals    3 snacks To provide 1800 kcal    225 g CHO    90 g pro   60 g fat   Monitoring and Evaluation: Patient will follow up in 1 weeks.

## 2014-08-20 ENCOUNTER — Telehealth: Payer: Self-pay | Admitting: Obstetrics & Gynecology

## 2014-08-20 ENCOUNTER — Encounter: Payer: BC Managed Care – PPO | Admitting: *Deleted

## 2014-08-20 DIAGNOSIS — F5000 Anorexia nervosa, unspecified: Secondary | ICD-10-CM | POA: Diagnosis not present

## 2014-08-20 NOTE — Progress Notes (Signed)
Assessment:  Patient was seen on 08/20/14 for nutrition counseling pertaining to disordered eating  Primary care MD: Nadyne Coombes in [facility] (doens't see her) Therapist: Gery Pray transitioning.  Recommended kristy someone for trauma.  No ED therapist.   Any other medical team members: none  Assessment-  Anxiety and OCD are worse.  Refuses medications.  No therapist appointment until next week and still no eating disorder therapist.  Is more restrictive this week because her OCD and anxiety are worse: more germs at school, more germs at home because kids are at home.  Terrified of vomiting and so refuses to eat.  Would rather not eat than be at risk for vomiting.  Is just trying to survive and to her that means not eating.  Her current living situation is also not safe emotionally.  She is not forthcoming with details, but her idea is to live long enough to marry this woman she lives with so that she can obtain a green card and stay in the country to finish her schooling.  Until that time she is not prepared to recover from her eating disorder.      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  She started back at school yesterday  Doesn't do well at school with following her meal plan, but she is required to stay in school due to her international Visa.  She is currently taking 12 credit hours: No classes tues or thurs MWF 9, 1, 2.  And online : spanish, alcohol and tobacco, global health.  Trauma foods: Butter, guac, blackerberries, avacado, rapseberries, seafood, and any white crumbly cheese nevagetive association: spaghetti, colored foods, soup/stews/chilis   Cassandra Colon states that the timing isn't right for her recovery from her eating disorder.  Her living situation isn't good, but she feels that she can't leave her current home.  She lives with her partner (female, I think, but I'm not sure) and her partner's 3 kids.  Cassandra Colon is responsible for  taking the kids to and from school.  She doesn't work in addition to school.  She has a significant trauma history that we didn't get into today, but she feels like that needs to be dealt with before she can deal with her eating disorder.  She says she will be able to focus better once she is out of school in 3 years (she plans on attending grad school next year).  She doesn't think her eating disorder is a pressing concern because she has never had any medical complications.  She says she has lost friends to their eating disorders, but she has never been "ill." She states her lab work always is fine and she's never had any issues with her schoolwork being affected.  She says her grades are always straight As so she never had focus or concentration issues.  She is content to survive right now, not to thrive.  She likes being less than 100 pounds because she can't feel as much when she's restricted.  She can control her feelings and stay in a numb state.  If she's underweight, her senses are dulled.  This must have something to do with her trauma; she doesn't want to feel it right now or deal with it so she stays in a "safe place."  She also describes being less than 100 as being "clean."  She has issues with food spoilage- she wont' eat anything unless it's straight out of the fridge, not even in  an insulted cooler with an ice pack.  She doesn't like to be weighed, doesn't want to know what she weighs.  When I questioned her about how to keep her accountable, she states that her partner won't be a good accountability person so she guesses she will have to be weighed by me.    Weight: 114 lb on 8/18 Height: 63 in Expected body weight: 115 + 11 pounds (101-126) Percent expected body weight: 100%  Eating history: Length of time: started restricting as a child got worse as a teen around age 64 Previous treatments: was hospitalized involuntarily in [place] a couple times, but they didn't really treat her;  she was just refed and put on too much psychotropic medications.  She came to the [place] about 5 years ago to attend college.  Since then she has been in and out of [facility] 3-4 times.  Worked with Iva Boop, RD in the past Goals for RD meetings: Cassandra Colon doesn't really like working with an RD; she does it because it's mandatory.  She feels that she can do an RDs job fine.  However, she does like someone being direct;  Don't beat around the bush. She needs to be able to trust someone and it takes awhile for her to learn to trust.  Likes RD to be flexible.  Likes it's when RD understands what it means to have an eating disorder;  don't just throw food at her.    Weight history:  Highest weight: 118 lb   Lowest weight: 77 lb Most consistent weight: 98 lb  What would you like to weigh:98 lb How has weight changed in the past year: refed at [facility] most recently; had previously lost what she gained in treatment in November    Mental health diagnosis: Anorexia nervosa   Dietary assessment: A typical day consists of 1-3 meals  Safe foods include: anything white, vegetables, fruit, some candy Avoided foods include:anything cady, chocolate, anything in restaurant, fried foods, meat, colored foods.  No yogurt.  doesn't eat peanut butter- it's American  24 hour recall: varied Some days eats only once  Not on official exercise restiction, but self- imposed  What Methods Do You Use To Control Your Weight (Compensatory behaviors)?           Restricting (calories, fat, carbs): restricting total calories.  Trying to follow meal plan- in the past limits to 200-700 kcal.  Restrict fluids (just  sip or might not even drink full bottle  SIV: denies  Diet pills: in the past  Laxatives: not recently  Diuretics: denies  Alcohol or drugs: denies  Exercise (what type):  Can overexerise.  doesn't allow herself to .  Exercise all time if she lets herself.    Food rules or rituals (explain): there's something  there with her trauma that we didn't get into today  Binge: denies  Estimated energy intake: Varies.  If she follows her meal plan she eats probably close to 1800 calories, most days she gets [phone] kcal.    Estimated energy needs: 1800 kcal 225 g CHO 90 g pro 60 g fat  Nutrition Diagnosis: NB-1.2 Harmful beliefs/attitudes about food or nutrition-related topics As related to proper balance of fat, carbohydrates, proteins to ensure adequate calories.  As evidenced by eating disorder.  Intervention/Goals: Focus on the safest foods: prepackaged snack items like Boost/Ensure and granola bars.  Eat every few hours to minimize risk for vomiting.   Call Ronal Fear at [facility]   Goal  Meal plan:    3 meals    3 snacks To provide 1800 kcal    225 g CHO    90 g pro   60 g fat  Breakfast: eat bagel with cream cheese in the car AM snack: M&Ms or granola bar and fruit Lunch: 3-4 snacky things: chips, almonds, fruit, granola bar.  nutella on bread (1 tbsp) with chips and fruit PM snack just in case: granola  Monitoring and Evaluation: Patient will follow up in 1 weeks.

## 2014-08-20 NOTE — Telephone Encounter (Signed)
Returned call to pharmacy.  They have contacted patient x 4 with no return call.  I advised that patient will return their call at her convenience. They said they will send letter.  Routing to provider for final review. Patient agreeable to disposition. Will close encounter

## 2014-08-20 NOTE — Patient Instructions (Addendum)
Try to eat every 2-3 hours. Bring safe food with you even if you don't feel like eating them. Try boost  Next visits: 2 pm 9/3, 9/10, 9/24, 10/8, 10/15

## 2014-08-20 NOTE — Telephone Encounter (Signed)
Pharmacy is calling stating they have been trying to get in contact with pt since 08/11/14 with no response.  They are wanting to check status of pts treatment to schedule the next lupron delivery. Wanting to know if we have anymore information about contacting the pt.

## 2014-08-25 ENCOUNTER — Telehealth: Payer: Self-pay | Admitting: Obstetrics & Gynecology

## 2014-08-25 NOTE — Telephone Encounter (Signed)
Spoke with patient. Patient states that she would like to come in to discuss her endometriosis and where she needs to go from here as she will not be having surgery. Offered consult appointment for 9/8 at 3pm but patient declines. Appointment scheduled for 9/22 at 3pm. Agreeable to date and time. Declines any current problems.  Routing to provider for final review. Patient agreeable to disposition. Will close encounter

## 2014-08-25 NOTE — Telephone Encounter (Signed)
Patient wants to have a follow up appointment with Dr.Miller for her endometriosis.

## 2014-08-27 ENCOUNTER — Ambulatory Visit: Payer: BC Managed Care – PPO | Admitting: *Deleted

## 2014-09-03 ENCOUNTER — Ambulatory Visit: Payer: BC Managed Care – PPO | Admitting: *Deleted

## 2014-09-15 ENCOUNTER — Ambulatory Visit (INDEPENDENT_AMBULATORY_CARE_PROVIDER_SITE_OTHER): Payer: BC Managed Care – PPO | Admitting: Obstetrics & Gynecology

## 2014-09-15 VITALS — BP 104/66 | HR 84 | Resp 18 | Ht 63.0 in | Wt 109.0 lb

## 2014-09-15 DIAGNOSIS — N9489 Other specified conditions associated with female genital organs and menstrual cycle: Secondary | ICD-10-CM

## 2014-09-15 DIAGNOSIS — N809 Endometriosis, unspecified: Secondary | ICD-10-CM

## 2014-09-15 MED ORDER — OXYCODONE-ACETAMINOPHEN 5-325 MG PO TABS: 2.0000 | ORAL_TABLET | ORAL | Status: DC | PRN

## 2014-09-15 NOTE — Progress Notes (Signed)
Subjective:     Patient ID: Cassandra Colon, female   DOB: 17-Jun-1981, 33 y.o.   MRN: [id number]  HPI 33 yo G0 MWF (female/female) here to discuss possible surgery for endometriosis.  This was planned at beginning of summer but postponed due to continued issues with anorexia.  Weight was down to low 90's when pt re-admitted to treatment facility in [place].  Pt's weight is improved.  She is still on Depo Lupron but she is nearing the end of the amount that it same to continue.  She has only received on dose in the last four to five months.  Contact with her while in [place] was quite hard.  Pt has changed nutritionist and counselor and provides me names for contacting before surgery will be scheduled.  Counselor is Insurance claims handler Patrini at [employer] and nutritionist is [employer].  She does these via Skype sessions weekly.  Located at [facility].  [phone].  She and I have discussed this in depth in the past.  She is co-parenting three children and has no desire for future child bearing.  She knows that I do not want to remove both ovaries but that may be needed in the future depending on pain issues.  She is okay with this but feels pain is worse with bleeding and right before cycle so she feels this would be a tremendous relief not to have pain during her cycle and right before.  She is aware I cannot guarantee complete resolution of pain and she is okay with this.    Review of Systems     Objective:   Physical Exam  Constitutional: She is oriented to person, place, and time. She appears well-developed and well-nourished.  Neurological: She is alert and oriented to person, place, and time.  Psychiatric: She has a normal mood and affect.   No other physical exam performed today    Assessment:     Endometriosis Chronic pelvic pain Anorexia     Plan:     Proceed with robotic assisted TLH/bilateral salpingectomy/possible BSO/cystoscopy.  Will try to schedule during winter  break--Dec 2 through Jan 10.   ~15 minutes spent with patient >50% of time was in face to face discussion of above.

## 2014-09-17 ENCOUNTER — Ambulatory Visit: Payer: BC Managed Care – PPO | Admitting: *Deleted

## 2014-09-18 ENCOUNTER — Telehealth: Payer: Self-pay | Admitting: Obstetrics & Gynecology

## 2014-09-18 NOTE — Telephone Encounter (Signed)
Harvie Heck from [facility] is calling to arrange a shipping date for patient's Lupron. They are thinking next Tuesday, 09/22/14, may be appropriate. Please advise.

## 2014-09-18 NOTE — Telephone Encounter (Signed)
Yes, with one more dose.

## 2014-09-18 NOTE — Telephone Encounter (Signed)
Call to [facility] and confirmed shipment for Tuesday. Encounter closed.

## 2014-09-18 NOTE — Telephone Encounter (Signed)
Routing to Dr. Hyacinth Meeker for review.  Will patient be continuing with Lupron?

## 2014-09-22 ENCOUNTER — Telehealth: Payer: Self-pay

## 2014-09-22 NOTE — Telephone Encounter (Signed)
Called pt to informed her Lupron injection is here in the office and did she like to schedule a appt. Appt made for 09/24/14 @ 8:30

## 2014-09-23 ENCOUNTER — Encounter: Payer: Self-pay | Admitting: Obstetrics & Gynecology

## 2014-09-24 ENCOUNTER — Ambulatory Visit (INDEPENDENT_AMBULATORY_CARE_PROVIDER_SITE_OTHER): Payer: BC Managed Care – PPO | Admitting: *Deleted

## 2014-09-24 VITALS — BP 100/70 | HR 92 | Ht 63.0 in | Wt 110.0 lb

## 2014-09-24 DIAGNOSIS — N809 Endometriosis, unspecified: Secondary | ICD-10-CM

## 2014-09-24 MED ORDER — LEUPROLIDE ACETATE 3.75 MG IM KIT
3.7500 mg | PACK | Freq: Once | INTRAMUSCULAR | Status: AC
  Administered 2014-09-24: 3.75 mg via INTRAMUSCULAR

## 2014-09-24 NOTE — Progress Notes (Signed)
Patient ID: Cassandra Colon, female   DOB: 1981/11/12, 33 y.o.   MRN: [id number] Pt here for Lupron injection.  Injection is tolerated well in right gluteal.  Pt is scheduled for surgery on 11/02/14. Pt denies any problems at this time.

## 2014-10-01 ENCOUNTER — Ambulatory Visit: Payer: BC Managed Care – PPO | Admitting: *Deleted

## 2014-10-08 ENCOUNTER — Ambulatory Visit: Payer: BC Managed Care – PPO | Admitting: *Deleted

## 2014-10-23 ENCOUNTER — Telehealth: Payer: Self-pay | Admitting: Obstetrics & Gynecology

## 2014-10-23 NOTE — Telephone Encounter (Signed)
[facility] is calling to verify if the patient is still on Lupron. They have been unable to reach the patient to deliver her Lupron.

## 2014-10-23 NOTE — Telephone Encounter (Signed)
Routing to Billie RuddySally Yeakley, Charity fundraiserN as dicussed.

## 2014-10-27 NOTE — Telephone Encounter (Signed)
Call to patient, LMTCB

## 2014-10-29 NOTE — Telephone Encounter (Signed)
Pt is calling Kennon RoundsSally back. Pt request a call back at [phone]

## 2014-10-29 NOTE — Telephone Encounter (Signed)
Return call to patient. Ad[facility]. Patient agreeable. Surgery instruction sheet reviewed and printed copy with pre and post surgical appointments mailed. Last Lupron injection was 09-24-14, 3.75 mg. Will she need another dose prior to surgery?

## 2014-10-30 ENCOUNTER — Telehealth: Payer: Self-pay | Admitting: Obstetrics & Gynecology

## 2014-10-30 NOTE — Telephone Encounter (Signed)
Chart to insurance and billing to update precert info. Encounter closed.

## 2014-10-30 NOTE — Telephone Encounter (Signed)
No further Lupron needed.  Thanks.  Can close encounter.

## 2014-10-30 NOTE — Telephone Encounter (Signed)
Left message for patient to call back. Need to go over surgery benefits. °

## 2014-11-09 NOTE — Telephone Encounter (Signed)
MAILED SURGERY PAYMENT LETTER  November 09, 2014   Dear Ms. Cassandra Colon,  Your surgery is scheduled for November 30, 2014.  Upon requesting authorization for your surgery, your insurance company has informed us that they will cover 80% of the charges after a $500.00 deductible, and you will be responsible to pay approximately $566.01_.  It is our office policy that this amount is paid in full two weeks prior to your surgery. Your payment is due November 16, 2014.  If there is a balance due after your insurance company pays their portion, we will send you a bill.  If there is a refund due to you, we will send you a check within one month.  Payment may be made by cash, check, Visa, MasterCard or American Express.  If payment is not made two weeks prior to your surgery, we will have to reschedule your surgery.  The above fee includes only our fee for the surgery and does not include charges you may have from the facility, anesthesia or pathology.  If you have any questions, please call us at [phone].

## 2014-11-11 NOTE — Telephone Encounter (Signed)
Left message for patient to call back  

## 2014-11-11 NOTE — Telephone Encounter (Signed)
Patient canceled her upcoming Surgery consult, and all post op appointments. Patient says she does not want to reschedule.

## 2014-11-12 ENCOUNTER — Institutional Professional Consult (permissible substitution): Payer: BC Managed Care – PPO | Admitting: Obstetrics & Gynecology

## 2014-11-13 NOTE — Telephone Encounter (Signed)
No, I talked with her several weeks ago and reviewed surgery instructions and scheduled appointments and she seemed ready to proceed. How do you want me to handle calling her?

## 2014-11-13 NOTE — Telephone Encounter (Signed)
Are you aware of this 

## 2014-11-14 NOTE — Telephone Encounter (Signed)
We need to call her to make sure she wants to cancel her surgery.  If she does, I am going to send a letter by certified mail that this will not be rescheduled with me and I will be happy to refer her elsewhere.

## 2014-11-16 NOTE — Telephone Encounter (Signed)
Return call to patient, LMTCB.  

## 2014-11-16 NOTE — Telephone Encounter (Signed)
Call to patient, LMTCB. Per ROI, ok to leave message on [phone]. LM I needed to speak with patient directly for update.

## 2014-11-18 NOTE — Telephone Encounter (Signed)
Call to patient. Per ROI, can leave detailed message and VM confirms number. LM I received message she wants to cancel surgery and I am calling for update. Requested she call back and ask to speak with me directly.

## 2014-11-23 NOTE — Telephone Encounter (Signed)
No response from patient. Call has been reviewed with Dr Hyacinth MeekerMiller. Case has been canceled.

## 2014-11-30 ENCOUNTER — Ambulatory Visit (HOSPITAL_COMMUNITY)
Admission: RE | Admit: 2014-11-30 | Payer: BC Managed Care – PPO | Source: Ambulatory Visit | Admitting: Obstetrics & Gynecology

## 2014-11-30 ENCOUNTER — Encounter (HOSPITAL_COMMUNITY): Admission: RE | Payer: Self-pay | Source: Ambulatory Visit

## 2014-11-30 SURGERY — ROBOTIC ASSISTED TOTAL HYSTERECTOMY
Anesthesia: General

## 2014-12-01 ENCOUNTER — Encounter: Payer: Self-pay | Admitting: Obstetrics & Gynecology

## 2014-12-01 NOTE — Telephone Encounter (Signed)
Letter written.  Can you print and I will sign it when back from the OR today.  Thanks.

## 2014-12-01 NOTE — Telephone Encounter (Signed)
Letter on your desk for signature and noted in EPIC as sent.

## 2014-12-07 ENCOUNTER — Ambulatory Visit: Payer: BC Managed Care – PPO | Admitting: Obstetrics & Gynecology

## 2014-12-08 ENCOUNTER — Telehealth: Payer: Self-pay | Admitting: *Deleted

## 2014-12-08 NOTE — Telephone Encounter (Signed)
Calling Pharmacy to cancel prescriptions, s/w Dominique with [facility] and cancelled prescription for Lupron per Dr. Hyacinth MeekerMiller.  Routed to provider for review, encounter closed.

## 2015-01-01 ENCOUNTER — Ambulatory Visit: Payer: BC Managed Care – PPO | Admitting: Obstetrics & Gynecology

## 2015-05-12 IMAGING — CR DG HAND COMPLETE 3+V*R*
3 series · 3 of 3 positions shown · non-contrast
Comparison: None.

CLINICAL DATA: Punched wall

EXAM:
RIGHT HAND - COMPLETE 3+ VIEW

[x hand pa right]
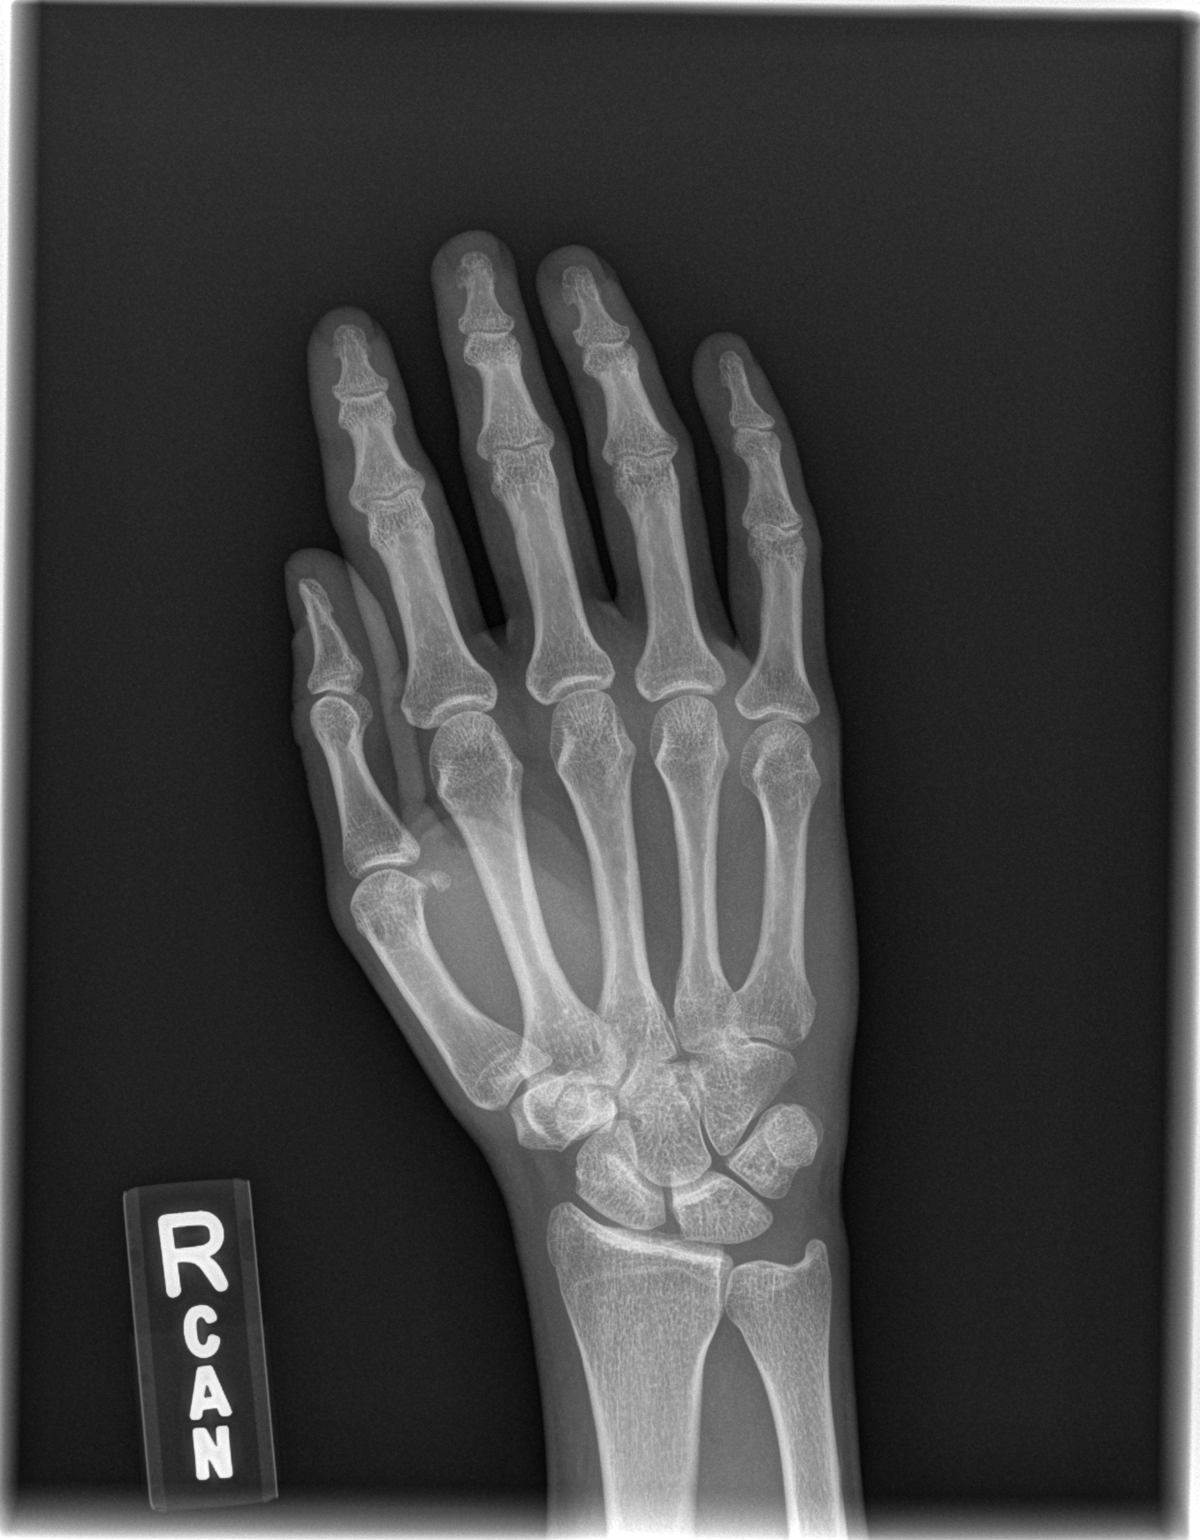

[x hand oblique right]
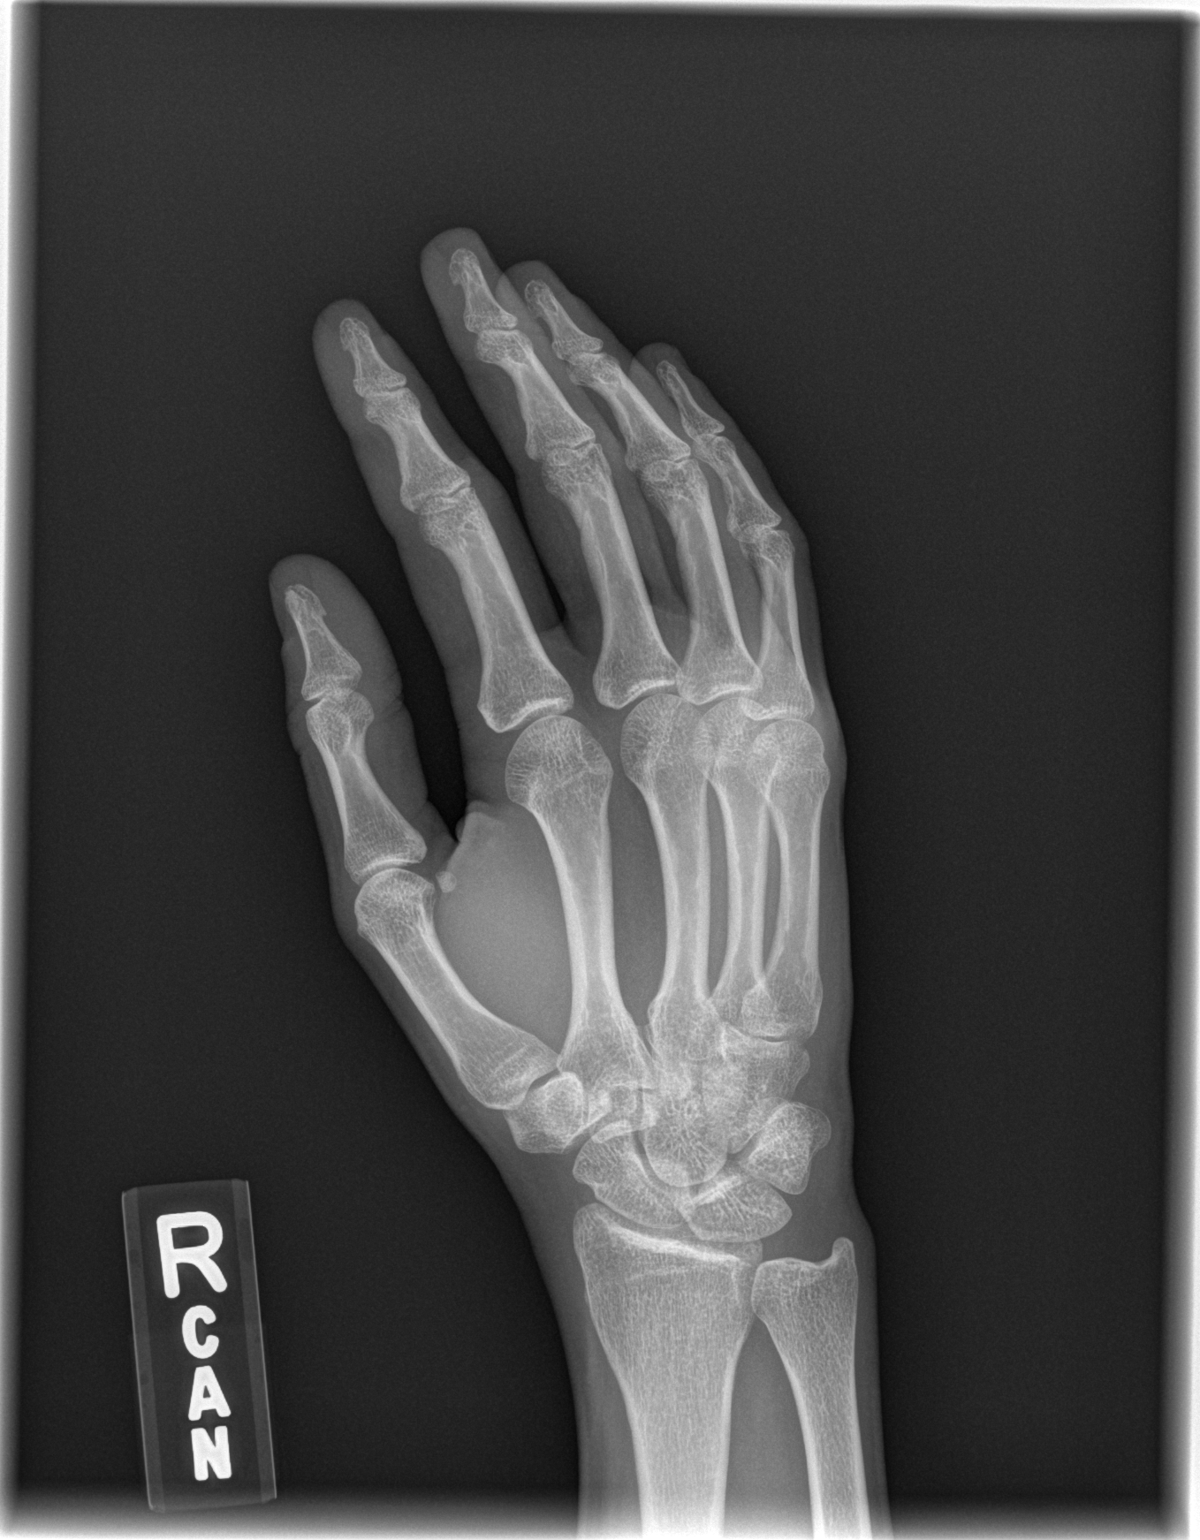

[x hand lat right]
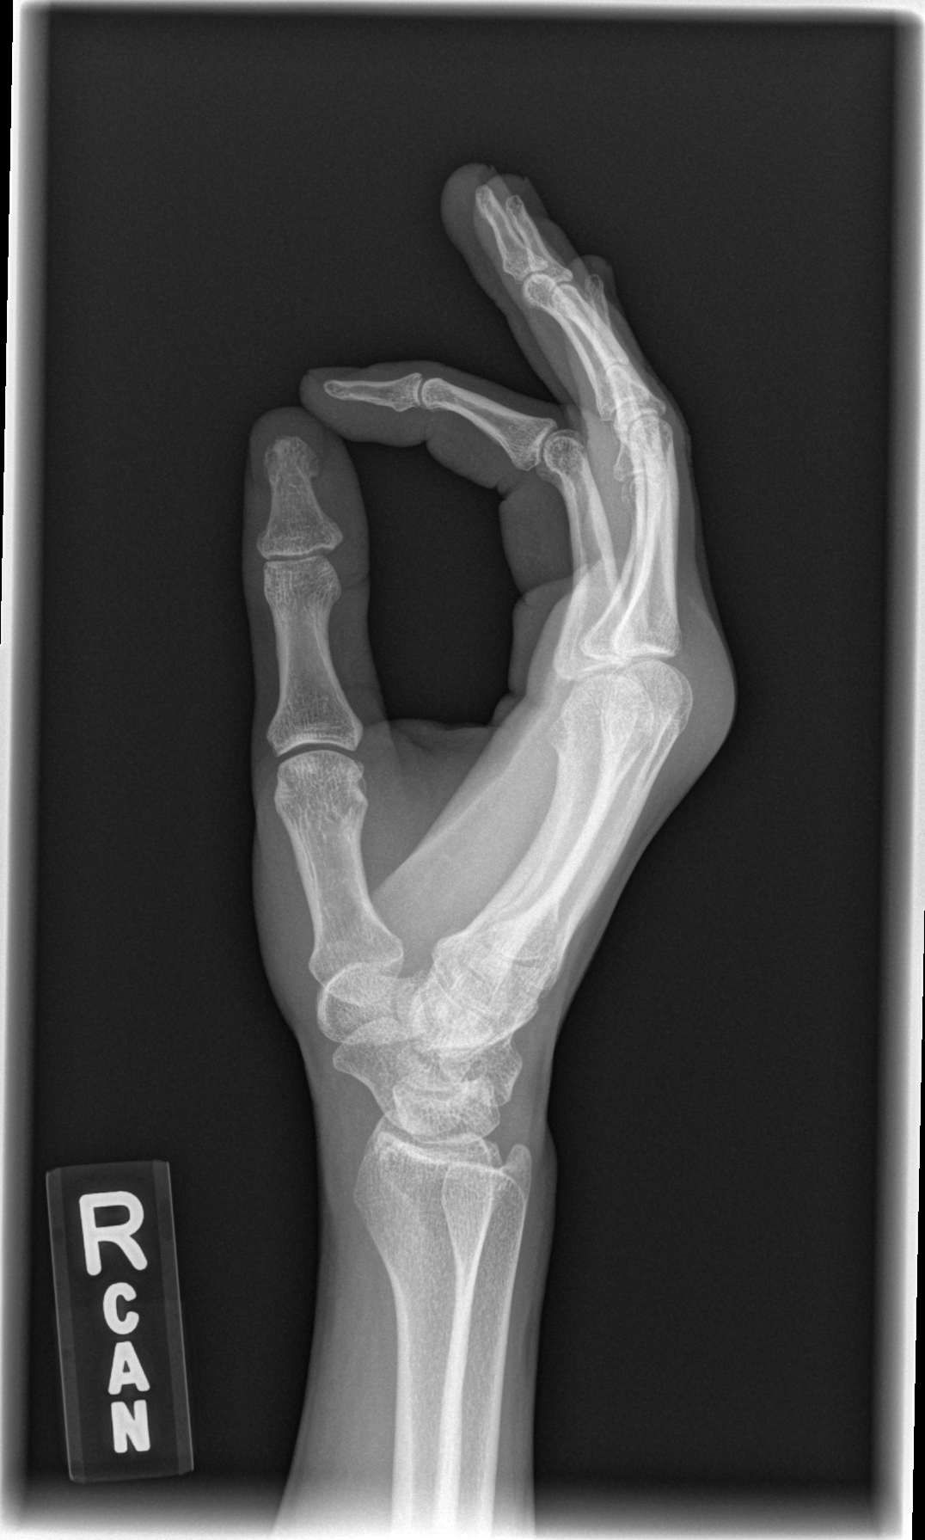

[3 of 3 positions shown; findings below may reference images not displayed]

FINDINGS: The joints are aligned. No evidence of subluxation or dislocation.
On the lateral view, soft tissue swelling is seen dorsal to the
metacarpophalangeal joints. No acute or healing fracture is
identified. No radiopaque foreign body.
IMPRESSION: No evidence of acute fracture.

Door bursal soft tissue swelling.
